# Patient Record
Sex: Female | Born: 1935 | Race: White | Hispanic: No | Smoking: Never smoker
Health system: Southern US, Community
[De-identification: ages and names within clinical notes are randomized; demographics above are authoritative.]

## PROBLEM LIST (undated history)

## (undated) DIAGNOSIS — I10 Essential (primary) hypertension: Secondary | ICD-10-CM

## (undated) DIAGNOSIS — G309 Alzheimer's disease, unspecified: Secondary | ICD-10-CM

## (undated) DIAGNOSIS — M329 Systemic lupus erythematosus, unspecified: Secondary | ICD-10-CM

## (undated) DIAGNOSIS — I509 Heart failure, unspecified: Secondary | ICD-10-CM

## (undated) DIAGNOSIS — I4891 Unspecified atrial fibrillation: Secondary | ICD-10-CM

## (undated) DIAGNOSIS — I779 Disorder of arteries and arterioles, unspecified: Secondary | ICD-10-CM

## (undated) DIAGNOSIS — IMO0002 Reserved for concepts with insufficient information to code with codable children: Secondary | ICD-10-CM

## (undated) DIAGNOSIS — J449 Chronic obstructive pulmonary disease, unspecified: Secondary | ICD-10-CM

## (undated) DIAGNOSIS — F0280 Dementia in other diseases classified elsewhere without behavioral disturbance: Secondary | ICD-10-CM

## (undated) DIAGNOSIS — E7800 Pure hypercholesterolemia, unspecified: Secondary | ICD-10-CM

## (undated) HISTORY — PX: TYMPANOSTOMY TUBE PLACEMENT: SHX32

## (undated) HISTORY — PX: FOOT SURGERY: SHX648

## (undated) HISTORY — PX: CHOLECYSTECTOMY: SHX55

## (undated) HISTORY — PX: TUBAL LIGATION: SHX77

---

## 2005-03-13 ENCOUNTER — Ambulatory Visit: Payer: Self-pay | Admitting: Cardiology

## 2005-03-14 ENCOUNTER — Inpatient Hospital Stay (HOSPITAL_COMMUNITY): Admission: AD | Admit: 2005-03-14 | Discharge: 2005-03-18 | Payer: Self-pay | Admitting: Internal Medicine

## 2005-03-14 ENCOUNTER — Ambulatory Visit: Payer: Self-pay | Admitting: *Deleted

## 2005-04-10 ENCOUNTER — Ambulatory Visit: Payer: Self-pay | Admitting: Cardiology

## 2005-08-01 ENCOUNTER — Ambulatory Visit: Payer: Self-pay | Admitting: Cardiology

## 2005-11-20 ENCOUNTER — Ambulatory Visit: Payer: Self-pay | Admitting: Cardiology

## 2006-02-27 ENCOUNTER — Ambulatory Visit: Payer: Self-pay | Admitting: Cardiology

## 2007-08-08 ENCOUNTER — Inpatient Hospital Stay (HOSPITAL_COMMUNITY): Admission: EM | Admit: 2007-08-08 | Discharge: 2007-08-12 | Payer: Self-pay | Admitting: Emergency Medicine

## 2007-08-10 ENCOUNTER — Encounter (INDEPENDENT_AMBULATORY_CARE_PROVIDER_SITE_OTHER): Payer: Self-pay | Admitting: Internal Medicine

## 2007-08-12 ENCOUNTER — Ambulatory Visit: Payer: Self-pay | Admitting: Gastroenterology

## 2007-08-12 ENCOUNTER — Ambulatory Visit: Payer: Self-pay | Admitting: Infectious Diseases

## 2007-11-17 ENCOUNTER — Ambulatory Visit: Payer: Self-pay | Admitting: Cardiology

## 2007-11-24 ENCOUNTER — Ambulatory Visit: Payer: Self-pay | Admitting: Cardiology

## 2007-11-26 ENCOUNTER — Ambulatory Visit: Payer: Self-pay | Admitting: Cardiology

## 2007-11-26 ENCOUNTER — Inpatient Hospital Stay (HOSPITAL_COMMUNITY): Admission: AD | Admit: 2007-11-26 | Discharge: 2007-11-30 | Payer: Self-pay | Admitting: Cardiology

## 2007-12-16 ENCOUNTER — Ambulatory Visit: Payer: Self-pay | Admitting: Cardiology

## 2008-11-29 ENCOUNTER — Ambulatory Visit: Payer: Self-pay | Admitting: Cardiology

## 2009-02-06 ENCOUNTER — Ambulatory Visit: Payer: Self-pay | Admitting: Cardiology

## 2009-04-20 ENCOUNTER — Emergency Department (HOSPITAL_COMMUNITY): Admission: EM | Admit: 2009-04-20 | Discharge: 2009-04-20 | Payer: Self-pay | Admitting: Emergency Medicine

## 2009-05-07 ENCOUNTER — Emergency Department (HOSPITAL_COMMUNITY): Admission: EM | Admit: 2009-05-07 | Discharge: 2009-05-07 | Payer: Self-pay | Admitting: Emergency Medicine

## 2009-05-09 ENCOUNTER — Emergency Department (HOSPITAL_COMMUNITY): Admission: EM | Admit: 2009-05-09 | Discharge: 2009-05-09 | Payer: Self-pay | Admitting: Emergency Medicine

## 2009-08-23 DIAGNOSIS — I2510 Atherosclerotic heart disease of native coronary artery without angina pectoris: Secondary | ICD-10-CM | POA: Insufficient documentation

## 2009-08-23 DIAGNOSIS — R0602 Shortness of breath: Secondary | ICD-10-CM | POA: Insufficient documentation

## 2009-08-23 DIAGNOSIS — I080 Rheumatic disorders of both mitral and aortic valves: Secondary | ICD-10-CM

## 2009-08-23 DIAGNOSIS — I10 Essential (primary) hypertension: Secondary | ICD-10-CM | POA: Insufficient documentation

## 2009-11-19 ENCOUNTER — Ambulatory Visit: Payer: Self-pay | Admitting: Cardiology

## 2009-11-19 ENCOUNTER — Inpatient Hospital Stay (HOSPITAL_COMMUNITY): Admission: EM | Admit: 2009-11-19 | Discharge: 2009-11-21 | Payer: Self-pay | Admitting: Emergency Medicine

## 2009-11-20 ENCOUNTER — Encounter: Payer: Self-pay | Admitting: Cardiology

## 2009-11-20 ENCOUNTER — Encounter (INDEPENDENT_AMBULATORY_CARE_PROVIDER_SITE_OTHER): Payer: Self-pay | Admitting: Internal Medicine

## 2009-12-02 ENCOUNTER — Emergency Department (HOSPITAL_COMMUNITY)
Admission: EM | Admit: 2009-12-02 | Discharge: 2009-12-02 | Payer: Self-pay | Source: Home / Self Care | Admitting: Emergency Medicine

## 2009-12-02 ENCOUNTER — Encounter: Payer: Self-pay | Admitting: Physician Assistant

## 2010-03-27 ENCOUNTER — Encounter: Payer: Self-pay | Admitting: Physician Assistant

## 2010-03-29 ENCOUNTER — Ambulatory Visit: Payer: Self-pay | Admitting: Cardiology

## 2010-03-29 DIAGNOSIS — I498 Other specified cardiac arrhythmias: Secondary | ICD-10-CM | POA: Insufficient documentation

## 2010-03-29 DIAGNOSIS — Z950 Presence of cardiac pacemaker: Secondary | ICD-10-CM

## 2010-03-29 DIAGNOSIS — I5032 Chronic diastolic (congestive) heart failure: Secondary | ICD-10-CM

## 2010-04-15 ENCOUNTER — Encounter (INDEPENDENT_AMBULATORY_CARE_PROVIDER_SITE_OTHER): Payer: Self-pay | Admitting: *Deleted

## 2010-04-19 ENCOUNTER — Encounter: Payer: Self-pay | Admitting: Physician Assistant

## 2010-04-24 ENCOUNTER — Encounter (INDEPENDENT_AMBULATORY_CARE_PROVIDER_SITE_OTHER): Payer: Self-pay | Admitting: *Deleted

## 2010-05-02 ENCOUNTER — Ambulatory Visit: Payer: Self-pay | Admitting: Physician Assistant

## 2010-05-03 ENCOUNTER — Encounter (INDEPENDENT_AMBULATORY_CARE_PROVIDER_SITE_OTHER): Payer: Self-pay | Admitting: *Deleted

## 2010-07-02 ENCOUNTER — Ambulatory Visit: Payer: Self-pay | Admitting: Cardiology

## 2010-09-18 ENCOUNTER — Encounter: Payer: Self-pay | Admitting: Cardiology

## 2010-09-18 ENCOUNTER — Encounter: Payer: Self-pay | Admitting: Physician Assistant

## 2010-09-19 ENCOUNTER — Ambulatory Visit: Payer: Self-pay | Admitting: Cardiology

## 2010-09-20 ENCOUNTER — Encounter: Payer: Self-pay | Admitting: Physician Assistant

## 2010-09-20 ENCOUNTER — Encounter: Payer: Self-pay | Admitting: Cardiology

## 2010-09-25 ENCOUNTER — Ambulatory Visit: Payer: Self-pay | Admitting: Internal Medicine

## 2010-09-25 ENCOUNTER — Encounter: Payer: Self-pay | Admitting: Emergency Medicine

## 2010-09-25 ENCOUNTER — Ambulatory Visit: Payer: Self-pay | Admitting: Cardiovascular Disease

## 2010-09-25 ENCOUNTER — Encounter: Payer: Self-pay | Admitting: Physician Assistant

## 2010-09-26 ENCOUNTER — Encounter: Payer: Self-pay | Admitting: Physician Assistant

## 2010-09-26 ENCOUNTER — Encounter (INDEPENDENT_AMBULATORY_CARE_PROVIDER_SITE_OTHER): Payer: Self-pay | Admitting: Internal Medicine

## 2010-09-26 ENCOUNTER — Inpatient Hospital Stay (HOSPITAL_COMMUNITY): Admission: EM | Admit: 2010-09-26 | Discharge: 2010-09-30 | Payer: Self-pay | Admitting: Internal Medicine

## 2010-09-27 ENCOUNTER — Encounter: Payer: Self-pay | Admitting: Physician Assistant

## 2010-09-27 ENCOUNTER — Encounter (INDEPENDENT_AMBULATORY_CARE_PROVIDER_SITE_OTHER): Payer: Self-pay | Admitting: Internal Medicine

## 2010-09-27 ENCOUNTER — Ambulatory Visit: Payer: Self-pay | Admitting: Vascular Surgery

## 2010-09-29 ENCOUNTER — Encounter: Payer: Self-pay | Admitting: Physician Assistant

## 2010-09-30 ENCOUNTER — Encounter: Payer: Self-pay | Admitting: Physician Assistant

## 2010-10-31 ENCOUNTER — Ambulatory Visit: Payer: Self-pay | Admitting: Cardiology

## 2010-10-31 ENCOUNTER — Encounter (INDEPENDENT_AMBULATORY_CARE_PROVIDER_SITE_OTHER): Payer: Self-pay | Admitting: *Deleted

## 2010-10-31 ENCOUNTER — Encounter: Payer: Self-pay | Admitting: Physician Assistant

## 2010-10-31 DIAGNOSIS — R0989 Other specified symptoms and signs involving the circulatory and respiratory systems: Secondary | ICD-10-CM

## 2010-10-31 DIAGNOSIS — I959 Hypotension, unspecified: Secondary | ICD-10-CM | POA: Insufficient documentation

## 2010-11-13 ENCOUNTER — Encounter: Payer: Self-pay | Admitting: Physician Assistant

## 2010-11-21 ENCOUNTER — Encounter: Payer: Self-pay | Admitting: Physician Assistant

## 2010-11-21 ENCOUNTER — Encounter (INDEPENDENT_AMBULATORY_CARE_PROVIDER_SITE_OTHER): Payer: Self-pay | Admitting: *Deleted

## 2010-11-21 ENCOUNTER — Ambulatory Visit
Admission: RE | Admit: 2010-11-21 | Discharge: 2010-11-21 | Payer: Self-pay | Source: Home / Self Care | Attending: Physician Assistant | Admitting: Physician Assistant

## 2010-11-22 ENCOUNTER — Telehealth (INDEPENDENT_AMBULATORY_CARE_PROVIDER_SITE_OTHER): Payer: Self-pay | Admitting: *Deleted

## 2010-11-28 ENCOUNTER — Emergency Department (HOSPITAL_COMMUNITY)
Admission: EM | Admit: 2010-11-28 | Discharge: 2010-11-28 | Payer: Self-pay | Source: Home / Self Care | Admitting: Emergency Medicine

## 2010-11-29 ENCOUNTER — Encounter: Payer: Self-pay | Admitting: Cardiology

## 2010-12-02 LAB — DIFFERENTIAL
Basophils Relative: 0 % (ref 0–1)
Eosinophils Absolute: 0.5 10*3/uL (ref 0.0–0.7)
Eosinophils Relative: 13 % — ABNORMAL HIGH (ref 0–5)
Monocytes Absolute: 0.4 10*3/uL (ref 0.1–1.0)
Monocytes Relative: 10 % (ref 3–12)

## 2010-12-02 LAB — BASIC METABOLIC PANEL
CO2: 27 mEq/L (ref 19–32)
Calcium: 8.9 mg/dL (ref 8.4–10.5)
Creatinine, Ser: 1.31 mg/dL — ABNORMAL HIGH (ref 0.4–1.2)
GFR calc Af Amer: 47 mL/min — ABNORMAL LOW (ref >60)

## 2010-12-02 LAB — GLUCOSE, CAPILLARY: Glucose-Capillary: 220 mg/dL — ABNORMAL HIGH (ref 70–99)

## 2010-12-02 LAB — CBC
Hemoglobin: 12.4 g/dL (ref 12.0–15.0)
MCH: 34.5 pg — ABNORMAL HIGH (ref 26.0–34.0)
MCHC: 34.1 g/dL (ref 30.0–36.0)
Platelets: 148 10*3/uL — ABNORMAL LOW (ref 150–400)

## 2010-12-02 LAB — URINE MICROSCOPIC-ADD ON

## 2010-12-02 LAB — URINE CULTURE: Culture: NO GROWTH

## 2010-12-02 LAB — URINALYSIS, ROUTINE W REFLEX MICROSCOPIC
Hgb urine dipstick: NEGATIVE
Protein, ur: 100 mg/dL — AB
Urobilinogen, UA: 0.2 mg/dL (ref 0.0–1.0)

## 2010-12-02 LAB — POCT CARDIAC MARKERS: Myoglobin, poc: 110 ng/mL (ref 12–200)

## 2010-12-03 ENCOUNTER — Emergency Department (HOSPITAL_COMMUNITY)
Admission: EM | Admit: 2010-12-03 | Discharge: 2010-12-03 | Payer: Self-pay | Source: Home / Self Care | Admitting: Emergency Medicine

## 2010-12-04 LAB — BASIC METABOLIC PANEL
BUN: 24 mg/dL — ABNORMAL HIGH (ref 6–23)
Calcium: 9.2 mg/dL (ref 8.4–10.5)
GFR calc non Af Amer: 43 mL/min — ABNORMAL LOW (ref >60)
Glucose, Bld: 285 mg/dL — ABNORMAL HIGH (ref 70–99)

## 2010-12-04 LAB — DIFFERENTIAL
Lymphocytes Relative: 30 % (ref 12–46)
Lymphs Abs: 1.3 10*3/uL (ref 0.7–4.0)
Monocytes Absolute: 0.4 10*3/uL (ref 0.1–1.0)
Monocytes Relative: 9 % (ref 3–12)
Neutro Abs: 1.8 10*3/uL (ref 1.7–7.7)

## 2010-12-04 LAB — URINE MICROSCOPIC-ADD ON

## 2010-12-04 LAB — HEPATIC FUNCTION PANEL
ALT: 14 U/L (ref 0–35)
Bilirubin, Direct: 0.1 mg/dL (ref 0.0–0.3)
Indirect Bilirubin: 0.2 mg/dL — ABNORMAL LOW (ref 0.3–0.9)
Total Bilirubin: 0.3 mg/dL (ref 0.3–1.2)

## 2010-12-04 LAB — CBC
HCT: 36 % (ref 36.0–46.0)
Hemoglobin: 12.4 g/dL (ref 12.0–15.0)
MCH: 34.6 pg — ABNORMAL HIGH (ref 26.0–34.0)
MCHC: 34.4 g/dL (ref 30.0–36.0)

## 2010-12-04 LAB — POCT CARDIAC MARKERS: Myoglobin, poc: 99.7 ng/mL (ref 12–200)

## 2010-12-04 LAB — URINALYSIS, ROUTINE W REFLEX MICROSCOPIC
Leukocytes, UA: NEGATIVE
Specific Gravity, Urine: 1.025 (ref 1.005–1.030)
Urine Glucose, Fasting: 250 mg/dL — AB
pH: 5 (ref 5.0–8.0)

## 2010-12-09 ENCOUNTER — Emergency Department (HOSPITAL_COMMUNITY)
Admission: EM | Admit: 2010-12-09 | Discharge: 2010-12-09 | Payer: Self-pay | Source: Home / Self Care | Admitting: Emergency Medicine

## 2010-12-09 LAB — POCT CARDIAC MARKERS
CKMB, poc: <1 ng/mL — ABNORMAL LOW (ref 1.0–8.0)
Myoglobin, poc: 127 ng/mL (ref 12–200)
Myoglobin, poc: 126 ng/mL (ref 12–200)
Troponin i, poc: <0.05 ng/mL (ref 0.00–0.09)
Troponin i, poc: <0.05 ng/mL (ref 0.00–0.09)

## 2010-12-10 NOTE — Assessment & Plan Note (Signed)
Summary: 2 MO F/U PER OV W/GENE ON 6/23-JM   Visit Type:  Follow-up Primary Provider:  Sherril Croon   History of Present Illness: the patient is an 77 year old female with history of diastolic heart failure. She also states her bradycardia and hypertension and coronary artery.the patient's son is monitoring her bone status very carefully as well as her salt intake. She denies any chest pain chills of breath orthopnea PND. She is limited in her mobility because of orthopedic problems. Her short-term memory also has been gradually getting worse. The patient is also very hard hearing and history is very difficult. It does not appear that this chest pain shortness of breath orthopnea or PND.  Preventive Screening-Counseling & Management  Alcohol-Tobacco     Smoking Status: quit     Year Quit: 1980   Current Medications (verified): 1)  Amlodipine Besylate 5 Mg Tabs (Amlodipine Besylate) .... Take 1 Tablet By Mouth Once A Day 2)  Prilosec 20 Mg Cpdr (Omeprazole) .... Take 1 Capsule By Mouth Twice A Day 3)  Simvastatin 20 Mg Tabs (Simvastatin) .... Take 1 Tablet By Mouth At Bedtime 4)  Vitamin B-12 100 Mcg Tabs (Cyanocobalamin) .... Take 1 Tablet By Mouth Once A Day 5)  Caltrate 600+d 600-400 Mg-Unit Tabs (Calcium Carbonate-Vitamin D) .... Take 1 Tablet By Mouth Three Times A Day 6)  Humalog Mix 75/25 75-25 % Susp (Insulin Lispro Prot & Lispro) .... Use As Directed 7)  Alprazolam 0.25 Mg Tabs (Alprazolam) .... Take 1/2-1 Tablet By Mouth Three Times A Day As Needed 8)  Hydrocodone-Acetaminophen 7.5-325 Mg Tabs (Hydrocodone-Acetaminophen) .... Take 1 Tablet By Mouth Three Times A Day As Needed 9)  Furosemide 20 Mg Tabs (Furosemide) .... Take 1 Tablet By Mouth Once A Day 10)  Aspir-Low 81 Mg Tbec (Aspirin) .... Take 1 Tablet By Mouth Once A Day 11)  Isosorbide Mononitrate 20 Mg Tabs (Isosorbide Mononitrate) .... Take 1 Tablet By Mouth Two Times A Day 12)  Lisinopril 40 Mg Tabs (Lisinopril) .... Take 1  Tablet By Mouth Once A Day 13)  Omeprazole 20 Mg Cpdr (Omeprazole) .... Take 1 Tablet By Mouth Once A Day 14)  Allopurinol 100 Mg Tabs (Allopurinol) .... Take 1 Tablet By Mouth Once A Day 15)  Dicyclomine Hcl 10 Mg Caps (Dicyclomine Hcl) .... Take 1 Tablet By Mouth Once A Day As Needed 16)  Lidoderm 5 % Ptch (Lidocaine) .... Apply Patch 12hours On and 12 Hours Off 17)  Proair Hfa 108 (90 Base) Mcg/act Aers (Albuterol Sulfate) .... As Needed 18)  Triamcinolone Acetonide 0.1 % Crea (Triamcinolone Acetonide) .... Apply Topically Two Times A Day 19)  Nitrostat 0.4 Mg Subl (Nitroglycerin) .Marland Kitchen.. 1 Tablet Under Tongue At Onset of Chest Pain; You May Repeat Every 5 Minutes For Up To 3 Doses.  Allergies (verified): 1)  ! Ativan 2)  ! Haldol  Comments:  Nurse/Medical Assistant: The patient is currently on medications but does not know the name or dosage at this time. Instructed to contact our office with details. Will update medication list at that time.  Past History:  Past Medical History: Last updated: 05/02/2010 MITRAL INSUFFICIENCY (ICD-396.3) HYPERTENSION, UNSPECIFIED (ICD-401.9) CAD, NATIVE VESSEL (ICD-414.01)... treated medically...borderline LAD lesion by cath, 1/09 Diabetes mellitus Chronic diastolic heart failure...EF 75%; no focal wall motion abns; NL RVF by echo, 1/11 Chronic kidney disease Interstitial lung disease Dyslipidemia Obesity Alzheimer's dementia  Past Surgical History: Last updated: 08/23/2009 Cholecystectomy  Social History: Last updated: 08/23/2009 Retired  Single  Tobacco Use -  Former.   Review of Systems       The patient complains of fatigue, shortness of breath, muscle weakness, and skin lesions.  The patient denies malaise, fever, weight gain/loss, vision loss, decreased hearing, hoarseness, chest pain, palpitations, prolonged cough, wheezing, sleep apnea, coughing up blood, abdominal pain, blood in stool, nausea, vomiting, diarrhea, heartburn,  incontinence, blood in urine, joint pain, leg swelling, rash, headache, fainting, dizziness, depression, anxiety, enlarged lymph nodes, easy bruising or bleeding, and environmental allergies.    Vital Signs:  Patient profile:   78 year old female Height:      60 inches Weight:      205 pounds Pulse rate:   55 / minute BP sitting:   145 / 79  (left arm) Cuff size:   large  Vitals Entered By: Carlye Grippe (July 02, 2010 1:34 PM)  Physical Exam  Additional Exam:  GEN:77 year old female, obese, in no distress HEENT: NCAT,PERRLA,EOMI NECK: palpable pulses, no bruits; no JVD; no TM LUNGS: mild bibasilar crackles, no wheezes HEART: RRR (S1S2), with diminished heart sounds; no significant murmurs; no rubs; no gallops ABD: protuberant, soft, NT; intact BS [id number], 2+ bilateral, nonpitting edema SKIN: warm, dry MUSC: no obvious deformity NEURO: A/O (x3)     Impression & Recommendations:  Problem # 1:  CHRONIC DIASTOLIC HEART FAILURE (ICD-428.32) continue current medical therapy. I made no changes. Her updated medication list for this problem includes:    Amlodipine Besylate 5 Mg Tabs (Amlodipine besylate) .Marland Kitchen... Take 1 tablet by mouth once a day    Furosemide 20 Mg Tabs (Furosemide) .Marland Kitchen... Take 1 tablet by mouth once a day    Aspir-low 81 Mg Tbec (Aspirin) .Marland Kitchen... Take 1 tablet by mouth once a day    Isosorbide Mononitrate 20 Mg Tabs (Isosorbide mononitrate) .Marland Kitchen... Take 1 tablet by mouth two times a day    Lisinopril 40 Mg Tabs (Lisinopril) .Marland Kitchen... Take 1 tablet by mouth once a day    Nitrostat 0.4 Mg Subl (Nitroglycerin) .Marland Kitchen... 1 tablet under tongue at onset of chest pain; you may repeat every 5 minutes for up to 3 doses.  Problem # 2:  BRADYCARDIA (ICD-427.89) heart rhythm stable. Her updated medication list for this problem includes:    Amlodipine Besylate 5 Mg Tabs (Amlodipine besylate) .Marland Kitchen... Take 1 tablet by mouth once a day    Aspir-low 81 Mg Tbec (Aspirin) .Marland Kitchen... Take 1 tablet  by mouth once a day    Isosorbide Mononitrate 20 Mg Tabs (Isosorbide mononitrate) .Marland Kitchen... Take 1 tablet by mouth two times a day    Lisinopril 40 Mg Tabs (Lisinopril) .Marland Kitchen... Take 1 tablet by mouth once a day    Nitrostat 0.4 Mg Subl (Nitroglycerin) .Marland Kitchen... 1 tablet under tongue at onset of chest pain; you may repeat every 5 minutes for up to 3 doses.  Problem # 3:  CAD, NATIVE VESSEL (ICD-414.01) no recurrent chest pain continue medical therapy Her updated medication list for this problem includes:    Amlodipine Besylate 5 Mg Tabs (Amlodipine besylate) .Marland Kitchen... Take 1 tablet by mouth once a day    Aspir-low 81 Mg Tbec (Aspirin) .Marland Kitchen... Take 1 tablet by mouth once a day    Isosorbide Mononitrate 20 Mg Tabs (Isosorbide mononitrate) .Marland Kitchen... Take 1 tablet by mouth two times a day    Lisinopril 40 Mg Tabs (Lisinopril) .Marland Kitchen... Take 1 tablet by mouth once a day    Nitrostat 0.4 Mg Subl (Nitroglycerin) .Marland Kitchen... 1 tablet under tongue at onset of chest pain; you  may repeat every 5 minutes for up to 3 doses.  Patient Instructions: 1)  Your physician recommends that you continue on your current medications as directed. Please refer to the Current Medication list given to you today. 2)  Follow up in  6 months

## 2010-12-10 NOTE — Letter (Signed)
Summary: Engineer, materials at [facility]  [address]   [place], [place] [address]   Phone: [phone]  Fax: [phone]        April 24, 2010 MRN: [id number]   Cherilynn Walstad [address] [place], [place]  [address]   Dear Ms. Blansett,  Your test ordered by Selena Batten has been reviewed by your physician (or physician assistant) and was found to be normal or stable. Your physician (or physician assistant) felt no changes were needed at this time.  ____ Echocardiogram  ____ Cardiac Stress Test  __X__ Lab Work  ____ Peripheral vascular study of arms, legs or neck  ____ CT scan or X-ray  ____ Lung or Breathing test  ____ Other:   Thank you.   Laray Anger, M.D., F.A.C.C. Jeanne Ivan, PA-C

## 2010-12-10 NOTE — Assessment & Plan Note (Signed)
Summary: 1 MONTH F/U ON DAY W/GS AND GD-JM   Visit Type:  Follow-up Primary Provider:  Vyas   History of Present Illness: 77 year old female returns for early scheduled followup.  When last seen, she had just recently been taken off Metroprolol, in the context of symptomatic bradycardia with resting heart rates as low as 40 bpm. When I saw her, I increased her Lasix for 5 days, for treatment of diastolic CHF exacerbation. I ordered followup labs, indicating K and renal function. A BNP level, however, was not executed. TSH and CBC were normal. LDL 36.  Patient returns today having diuresed over 30 pounds. Her son and daughter-in-law, who were not present here last month, are thrilled as to how well she has been doing. As noted earlier, the patient has significant underlying dementia, and is not a reliable historian.  Preventive Screening-Counseling & Management  Alcohol-Tobacco     Smoking Status: quit  Current Medications (verified): 1)  Amlodipine Besylate 5 Mg Tabs (Amlodipine Besylate) .... Take 1 Tablet By Mouth Once A Day 2)  Prilosec 20 Mg Cpdr (Omeprazole) .... Take 1 Capsule By Mouth Twice A Day 3)  Simvastatin 20 Mg Tabs (Simvastatin) .... Take 1 Tablet By Mouth At Bedtime 4)  Vitamin B-12 100 Mcg Tabs (Cyanocobalamin) .... Take 1 Tablet By Mouth Once A Day 5)  Caltrate 600+d 600-400 Mg-Unit Tabs (Calcium Carbonate-Vitamin D) .... Take 1 Tablet By Mouth Three Times A Day 6)  Humalog Mix 75/25 75-25 % Susp (Insulin Lispro Prot & Lispro) .... Use As Directed 7)  Alprazolam 0.25 Mg Tabs (Alprazolam) .... Take 1/2-1 Tablet By Mouth Three Times A Day As Needed 8)  Hydrocodone-Acetaminophen 7.5-325 Mg Tabs (Hydrocodone-Acetaminophen) .... Take 1 Tablet By Mouth Three Times A Day As Needed 9)  Furosemide 20 Mg Tabs (Furosemide) .... Take 1 Tablet By Mouth Once A Day 10)  Aspir-Low 81 Mg Tbec (Aspirin) .... Take 1 Tablet By Mouth Once A Day 11)  Isosorbide Mononitrate 20 Mg Tabs  (Isosorbide Mononitrate) .... Take 1 Tablet By Mouth Two Times A Day 12)  Lisinopril 40 Mg Tabs (Lisinopril) .... Take 1 Tablet By Mouth Once A Day 13)  Omeprazole 20 Mg Cpdr (Omeprazole) .... Take 1 Tablet By Mouth Once A Day 14)  Allopurinol 100 Mg Tabs (Allopurinol) .... Take 1 Tablet By Mouth Once A Day 15)  Dicyclomine Hcl 10 Mg Caps (Dicyclomine Hcl) .... Take 1 Tablet By Mouth Once A Day As Needed 16)  Lidoderm 5 % Ptch (Lidocaine) .... Apply Patch 12hours On and 12 Hours Off 17)  Proair Hfa 108 (90 Base) Mcg/act Aers (Albuterol Sulfate) .... As Needed 18)  Triamcinolone Acetonide 0.1 % Crea (Triamcinolone Acetonide) .... Apply Topically Two Times A Day 19)  Nitrostat 0.4 Mg Subl (Nitroglycerin) .Marland Kitchen.. 1 Tablet Under Tongue At Onset of Chest Pain; You May Repeat Every 5 Minutes For Up To 3 Doses.  Allergies: 1)  ! Ativan 2)  ! Haldol  Comments:  Nurse/Medical Assistant: Pt did not bring medication list to office visit today. Her family will reviews meds as per instructions sheet and notify of any corrections. Cyril Loosen, RN, BSN (May 02, 2010 1:13 PM)  Past History:  Past Medical History: Last updated: 03/29/2010 MITRAL INSUFFICIENCY (ICD-396.3) HYPERTENSION, UNSPECIFIED (ICD-401.9) CAD, NATIVE VESSEL (ICD-414.01)... treated medically Diabetes mellitus Chronic diastolic heart failure Chronic kidney disease Interstitial lung disease Dyslipidemia Obesity Alzheimer's dementia  Review of Systems  The patient denies chest pain, syncope, and dyspnea on exertion.  has had recent cough and has chronic lower extremity edema, but with no recent exacerbation. Remaining systems reviewed, and are negative  Vital Signs:  Patient profile:   77 year old female Height:      60 inches Weight:      172.25 pounds Pulse rate:   60 / minute BP sitting:   105 / 65  (right arm) Cuff size:   large  Vitals Entered By: Cyril Loosen, RN, BSN (May 02, 2010 1:12  PM)   Physical Exam  Additional Exam:  GEN:77 year old female, obese, in no distress HEENT: NCAT,PERRLA,EOMI NECK: palpable pulses, no bruits; no JVD; no TM LUNGS: mild bibasilar crackles, no wheezes HEART: RRR (S1S2), with diminished heart sounds; no significant murmurs; no rubs; no gallops ABD: protuberant, soft, NT; intact BS [id number], 2+ bilateral, nonpitting edema SKIN: warm, dry MUSC: no obvious deformity NEURO: A/O (x3)     Impression & Recommendations:  Problem # 1:  CHRONIC DIASTOLIC HEART FAILURE (ICD-428.32)  patient has lost 36 pounds since her last visit and appears to be much improved, as reported by her family. Followup labs were normal. Will continue low dose maintenance furosemide, and repeat a complete metabolic profile, and also include a baseline BNP level. Family was advised to monitor daily weights, and to refrain from any added salt in her diet. Will schedule return clinic visit with myself and Dr. Andee Lineman in 2 months.  Problem # 2:  BRADYCARDIA (ICD-427.89)  resolved, following previous discontinuation of metoprolol. Continue current medications.  Problem # 3:  HYPERTENSION, UNSPECIFIED (ICD-401.9)  well-controlled on current medications.  Problem # 4:  CAD, NATIVE VESSEL (ICD-414.01)  continue conservative management with medical therapy.  Other Orders: T-Comprehensive Metabolic Panel [id number]) T-BNP  (B Natriuretic Peptide) ([id number])  Patient Instructions: 1)  Your physician recommends that you go to the [facility] for lab work: Do today! 2)  Follow up with Dr. Earnestine Leys on Tuesday, July 02, 2010 at 1:30pm.

## 2010-12-10 NOTE — Miscellaneous (Signed)
  Clinical Lists Changes  Observations: Added new observation of PAST MED HX: MITRAL INSUFFICIENCY (ICD-396.3) HYPERTENSION, UNSPECIFIED (ICD-401.9) CAD, NATIVE VESSEL (ICD-414.01)... treated medically...borderline LAD lesion by cath, 1/09 Diabetes mellitus Chronic diastolic heart failure...EF 75%; no focal wall motion abns; NL RVF by echo, 1/11 Chronic kidney disease Interstitial lung disease Dyslipidemia Obesity Alzheimer's dementia  (05/02/2010 13:55)       Past History:  Past Medical History: MITRAL INSUFFICIENCY (ICD-396.3) HYPERTENSION, UNSPECIFIED (ICD-401.9) CAD, NATIVE VESSEL (ICD-414.01)... treated medically...borderline LAD lesion by cath, 1/09 Diabetes mellitus Chronic diastolic heart failure...EF 75%; no focal wall motion abns; NL RVF by echo, 1/11 Chronic kidney disease Interstitial lung disease Dyslipidemia Obesity Alzheimer's dementia

## 2010-12-10 NOTE — Assessment & Plan Note (Signed)
Summary: bradycardia -- agh   Visit Type:  bradycardia Primary Provider:  Vyas   History of Present Illness: 77 year old female, patient of Dr. Andee Lineman, with history of multivessel CAD, treated medically, chronic diastolic heart failure, HTN, DM, MR, CRI, ILD, and dementia.   During a scheduled follow up with Dr. Sherril Croon earlier this week, patient appeared much more sluggish than usual. A 12-lead EKG was obtained, and indicated a resting heart rate of 40 bpm. Patient had been placed on metoprolol earlier this year, when hospitalized at [facility] for chest pain. She was felt to have both typical/atypical features, including discomfort reproducible by palpation. Serial cardiac markers were normal. She was placed on metoprolol 25 b.i.d., and Imdur was increased to 60 b.i.d.   The patient has significant dementia, and lives with her daughter, who is present today. The daughter points out that the patient has appeared to be much more sluggish these past few weeks. She continues to have occasional chest pain, but with no exacerbation. She denies any evidence of falls or syncope.  Patient was instructed to stop metoprolol, when seen by Dr. Sherril Croon the other day. Her daughter suggests that the patient appears much more alert since then, particularly today.  Preventive Screening-Counseling & Management  Alcohol-Tobacco     Smoking Status: quit     Year Quit: 1980  Current Medications (verified): 1)  Amlodipine Besylate 5 Mg Tabs (Amlodipine Besylate) .... Take 1 Tablet By Mouth Once A Day 2)  Prilosec 20 Mg Cpdr (Omeprazole) .... Take 1 Capsule By Mouth Twice A Day 3)  Simvastatin 20 Mg Tabs (Simvastatin) .... Take 1 Tablet By Mouth At Bedtime 4)  Vitamin B-12 100 Mcg Tabs (Cyanocobalamin) .... Take 1 Tablet By Mouth Once A Day 5)  Caltrate 600+d 600-400 Mg-Unit Tabs (Calcium Carbonate-Vitamin D) .... Take 1 Tablet By Mouth Three Times A Day 6)  Humalog Mix 75/25 75-25 % Susp (Insulin Lispro Prot & Lispro)  .... Use As Directed 7)  Alprazolam 0.25 Mg Tabs (Alprazolam) .... Take 1/2-1 Tablet By Mouth Three Times A Day As Needed 8)  Hydrocodone-Acetaminophen 7.5-325 Mg Tabs (Hydrocodone-Acetaminophen) .... Take 1 Tablet By Mouth Three Times A Day As Needed 9)  Furosemide 20 Mg Tabs (Furosemide) .... Take 1 Tablet By Mouth Once A Day 10)  Aspir-Low 81 Mg Tbec (Aspirin) .... Take 1 Tablet By Mouth Once A Day 11)  Isosorbide Mononitrate 20 Mg Tabs (Isosorbide Mononitrate) .... Take 1 Tablet By Mouth Two Times A Day 12)  Lisinopril 40 Mg Tabs (Lisinopril) .... Take 1 Tablet By Mouth Once A Day 13)  Omeprazole 20 Mg Cpdr (Omeprazole) .... Take 1 Tablet By Mouth Once A Day 14)  Allopurinol 100 Mg Tabs (Allopurinol) .... Take 1 Tablet By Mouth Once A Day 15)  Dicyclomine Hcl 10 Mg Caps (Dicyclomine Hcl) .... Take 1 Tablet By Mouth Once A Day As Needed 16)  Lidoderm 5 % Ptch (Lidocaine) .... Apply Patch 12hours On and 12 Hours Off 17)  Proair Hfa 108 (90 Base) Mcg/act Aers (Albuterol Sulfate) .... As Needed 18)  Triamcinolone Acetonide 0.1 % Crea (Triamcinolone Acetonide) .... Apply Topically Two Times A Day  Allergies (verified): 1)  ! Ativan 2)  ! Haldol  Comments:  Nurse/Medical Assistant: The patient's medications and allergies were reviewed with the patient and were updated in the Medication and Allergy Lists. List reviewed.  Past History:  Past Medical History: MITRAL INSUFFICIENCY (ICD-396.3) HYPERTENSION, UNSPECIFIED (ICD-401.9) CAD, NATIVE VESSEL (ICD-414.01)... treated medically Diabetes mellitus Chronic diastolic  heart failure Chronic kidney disease Interstitial lung disease Dyslipidemia Obesity Alzheimer's dementia  Review of Systems       No fevers, chills, hemoptysis, dysphagia, melena, hematocheezia, hematuria, rash, claudication. patient reportedly does have to sleep on 2 pillows, has chronic pedal edema, but no PND. All other systems negative.   Vital Signs:  Patient  profile:   77 year old female Height:      60 inches Weight:      208 pounds BMI:     40.77 Pulse rate:   81 / minute BP sitting:   115 / 70  (left arm) Cuff size:   large  Vitals Entered By: Carlye Grippe (Mar 29, 2010 2:36 PM)  Physical Exam  Additional Exam:  GEN:77 year old female, obese, in no distress HEENT: NCAT,PERRLA,EOMI NECK: palpable pulses, no bruits; no JVD; no TM LUNGS: mild bibasal crackles, no wheezes HEART: RRR (S1S2); no significant murmurs; no rubs; no gallops ABD: soft, NT; intact BS [id number], 2+ bilateral, nonpitting edema SKIN: warm, dry MUSC: no obvious deformity NEURO: A/O (x3)     EKG  Procedure date:  03/27/2010  Findings:      twelve-lead EKG, from Dr. Bonnita Levan office, indicates marked sinus bradycardia at 44 bpm with LAD, in no acute changes.  Impression & Recommendations:  Problem # 1:  BRADYCARDIA (ICD-427.89) bradycardia has resolved, following recent discontinuation of metoprolol. Heart rate today is 81 bpm. Pressure is normal. Patient appears to have increased energy, as reported by her daughter. She is not suggesting an acceleration of her baseline angina pattern. Therefore, would not resume beta blocker, but would suggest increasing isosorbide, if she were to have worsening chest pain. We'll provide p.r.n. nitroglycerin. We'll schedule return clinical with myself and Dr. Andee Lineman in one month, or sooner if needed.  Problem # 2:  CHRONIC DIASTOLIC HEART FAILURE (ICD-428.32) patient has evidence of volume overload on physical examination. Will increase Lasix to 40 mg daily (x5 days), then resume maintenance dose of 20 mg daily. Patient is due for complete blood work, and we will check a repeat metabolic profile in one week, for close monitoring of electrolytes and renal function. Will also add a BNP level, to help tailor her treatment regimen.  Problem # 3:  CAD, NATIVE VESSEL (ICD-414.01) continue medication regimen. If needed, will increase  isosorbide, for worsening chest pain. Continued conservative management is recommended.  Problem # 4:  HYPERTENSION, UNSPECIFIED (ICD-401.9) Assessment: Comment Only  Other Orders: T-Basic Metabolic Panel [id number])  Patient Instructions: 1)  Increase Lasix to 40mg  by mouth for 5 days (2 of the 20mg  tablets). Then resume 20mg  by mouth once daily. 2)  Your physician recommended you take 1 tablet (or 1 spray) under tongue at onset of chest pain; you may repeat every 5 minutes for up to 3 doses. If 3 or more doses are required, call 911 and proceed to the ER immediately. 3)  Your physician recommends that you go to the [facility] for lab work: DO LAB WORK IN 1 WEEK (AROUND 5/27) 4)  Your physician recommends that you weigh, daily, at the same time every day, and in the same amount of clothing.  Please record your daily weights on the handout provided and bring it to your next appointment. 5)  NO SALT DIET. 6)  FOLLOW UP WITH GENE Lachandra Dettmann, PA-C ON THURSDAY, JUNE 23RD AT 1PM. Prescriptions: NITROSTAT 0.4 MG SUBL (NITROGLYCERIN) 1 tablet under tongue at onset of chest pain; you may repeat every 5 minutes for up  to 3 doses.  #25 x 1   Entered by:   Cyril Loosen, RN, BSN   Authorized by:   Nelida Meuse, PA-C   Signed by:   Cyril Loosen, RN, BSN on 03/29/2010   Method used:   Electronically to        [facility]* (retail)       [address]       [place], [place]  [address]       Ph: [phone]       Fax: [phone]   RxID:   [id number]

## 2010-12-10 NOTE — Consult Note (Signed)
Summary: Consultation Report - [facility]  Consultation Report - [facility]   Imported By: Marylou Mccoy 12/10/2009 14:00:02  _____________________________________________________________________  External Attachment:    Type:   Image     Comment:   External Document

## 2010-12-10 NOTE — Letter (Signed)
Summary: External Correspondence/ OFFICE VISIT [facility]  External Correspondence/ OFFICE VISIT [facility]   Imported By: Dorise Hiss 03/27/2010 15:08:40  _____________________________________________________________________  External Attachment:    Type:   Image     Comment:   External Document

## 2010-12-10 NOTE — Medication Information (Signed)
Summary: RX Folder/ MEDICATIONS [facility]  RX Folder/ MEDICATIONS [facility]   Imported By: Dorise Hiss 03/27/2010 15:10:52  _____________________________________________________________________  External Attachment:    Type:   Image     Comment:   External Document

## 2010-12-10 NOTE — Letter (Signed)
Summary: Engineer, materials at [facility]  [address]   [place], [place] [address]   Phone: [phone]  Fax: [phone]        May 03, 2010 MRN: [id number]   [name] [address] [place], [place]  [address]   Dear Ms. Agcaoili,  Your test ordered by Selena Batten has been reviewed by your physician (or physician assistant) and was found to be normal or stable. Your physician (or physician assistant) felt no changes were needed at this time.  ____ Echocardiogram  ____ Cardiac Stress Test  __X__ Lab Work  ____ Peripheral vascular study of arms, legs or neck  ____ CT scan or X-ray  ____ Lung or Breathing test  ____ Other:   Thank you.   Hoover Brunette, LPN    Duane Boston, M.D., F.A.C.C. Thressa Sheller, M.D., F.A.C.C. Oneal Grout, M.D., F.A.C.C. Cheree Ditto, M.D., F.A.C.C. Daiva Nakayama, M.D., F.A.C.C. Kenney Houseman, M.D., F.A.C.C. Jeanne Ivan, PA-C

## 2010-12-10 NOTE — Letter (Signed)
Summary: Generic Engineer, agricultural at [facility] [address]   [place], [place] [address]   Phone: [phone]  Fax: [phone]        April 15, 2010 MRN: [id number]    [name] [address] [place], [place]  [address]    Dear Ms. Markman,    You were asked to have lab work done one week following you office visit on May 20th. We also mailed a letter to you on June 2nd to remind you to have this lab work done. However, it does not appear this lab work has been completed yet.  Please, take the enclosed order to the [facility] as soon as possible to have this done. If you will not be able to do this lab work, please notify our office so that we can properly document this in your chart.      Sincerely,  Cyril Loosen, RN, BSN  This letter has been electronically signed by your physician.  Appended Document: Generic Letter Letter will be mailed certified. Attempted to reach pt by phone, but number is not in service.

## 2010-12-12 NOTE — Assessment & Plan Note (Signed)
Summary: 2-3 WK F/U 12/22 OV-JM   Visit Type:  Follow-up Primary Provider:  Sherril Croon   History of Present Illness: [name] presents for early scheduled followup. Please refer to my OV note of 12/22, for complete details.  When last seen, I stopped her lisinopril, and decreased Norvasc, for correction of significant hypotension. I was also concerned that she might have left SCA stenosis; however, carotid Dopplers did not suggest this. I also referred her for a dobutamine stress echocardiogram, for risk stratification, and this was negative. Followup labs were stable.  Clinically, the [name] appears quite stable. She has significant dementia, and history is provided by her daughter.  [name] has gained 2 pounds since last OV. She is currently not on a diuretic. Her daughter states that she gives this to her only on occasion, concerned with development of renal insufficiency, in the past.  Preventive Screening-Counseling & Management  Alcohol-Tobacco     Smoking Status: quit     Year Quit: 1980  Current Medications (verified): 1)  Amlodipine Besylate 2.5 Mg Tabs (Amlodipine Besylate) .... Take One Tablet By Mouth Daily 2)  Simvastatin 20 Mg Tabs (Simvastatin) .... Take 1 Tablet By Mouth Two Times A Day 3)  Vitamin B-12 100 Mcg Tabs (Cyanocobalamin) .... Take 1 Tablet By Mouth Once A Day 4)  Oscal 500/200 D-3 500-200 Mg-Unit Tabs (Calcium Carbonate-Vitamin D) .... Take 1 Tablet By Mouth Two Times A Day 5)  Humalog Mix 75/25 75-25 % Susp (Insulin Lispro Prot & Lispro) .... Use As Directed 6)  Aspir-Low 81 Mg Tbec (Aspirin) .... Take 1 Tablet By Mouth Once A Day 7)  Isosorbide Mononitrate 20 Mg Tabs (Isosorbide Mononitrate) .... Take 1 Tablet By Mouth Two Times A Day 8)  Omeprazole 20 Mg Cpdr (Omeprazole) .... Take 1 Tablet By Mouth Once A Day 9)  Allopurinol 100 Mg Tabs (Allopurinol) .... Take 1 Tablet By Mouth Once A Day 10)  Dicyclomine Hcl 10 Mg Caps (Dicyclomine Hcl) .... Take 1 Tablet By  Mouth Four Times A Day As Needed 11)  Nitrostat 0.4 Mg Subl (Nitroglycerin) .Marland Kitchen.. 1 Tablet Under Tongue At Onset of Chest Pain; You May Repeat Every 5 Minutes For Up To 3 Doses. 12)  Zofran 4 Mg Tabs (Ondansetron Hcl) .... Take One By Mouth Every 6 Hours As Needed 13)  Vitamin B-12 1000 Mcg Tabs (Cyanocobalamin) .... Take 1 Tablet By Mouth Once A Day  Allergies (verified): 1)  ! Ativan 2)  ! Haldol  Comments:  Nurse/Medical Assistant: The [name] is currently on medications but does not know the name or dosage at this time. Instructed to contact our office with details. Will update medication list at that time.  Past History:  Past Medical History: Last updated: 05/02/2010 MITRAL INSUFFICIENCY (ICD-396.3) HYPERTENSION, UNSPECIFIED (ICD-401.9) CAD, NATIVE VESSEL (ICD-414.01)... treated medically...borderline LAD lesion by cath, 1/09 Diabetes mellitus Chronic diastolic heart failure...EF 75%; no focal wall motion abns; NL RVF by echo, 1/11 Chronic kidney disease Interstitial lung disease Dyslipidemia Obesity Alzheimer's dementia  Review of Systems       No fevers, chills, hemoptysis, dysphagia, melena, hematocheezia, hematuria, rash, claudication, orthopnea, or pnd. All other systems negative.   Vital Signs:  [name] profile:   77 year old female Height:      60 inches Weight:      213 pounds Pulse rate:   65 / minute BP sitting:   129 / 85  (right arm) Cuff size:   large  Vitals Entered By: Carlye Grippe (November 21, 2010 1:56 PM)  Physical Exam  Additional Exam:  GEN:77 year old female, obese, sitting in a wheelchair, in no distress HEENT: NCAT,PERRLA,EOMI NECK: palpable pulses, no bruits; no JVD; no TM LUNGS: mild bibasilar crackles, no wheezes HEART: RRR (S1S2), with diminished heart sounds; no significant murmurs; no rubs; no gallops ABD: protuberant, soft, NT; intact BS [id number], 2+ bilateral, nonpitting edema SKIN: warm, dry MUSC: no obvious  deformity NEURO: A/O (x3)     Impression & Recommendations:  Problem # 1:  CHRONIC DIASTOLIC HEART FAILURE (ICD-428.32)  Will add Lasix 20 mg daily, and check baseline metabolic profile/BNP level today, with repeat BMET in one week. [name] is to refrain from added salt in her diet, and to try to be weighed daily. She has gained 2 pounds since last OV.  Problem # 2:  HYPOTENSION (ICD-458.9)  much improved, following recent discontinuation of ACE inhibitor, and down titration of Norvasc. Of note, I am still concerned that she may have left SCA stenosis. She has an absent left radial pulse. I will have recent carotid Doppler study reviewed.  Problem # 3:  BRADYCARDIA (ICD-427.89)  resolved; on no nodal blocking agents.  Other Orders: T-Basic Metabolic Panel [phone]) T-BNP  (B Natriuretic Peptide) [phone]) T-Basic Metabolic Panel [id number])  [name] Instructions: 1)  Your physician wants you to follow-up in: 3 months. You will receive a reminder letter in the mail one-two months in advance. If you don't receive a letter, please call our office to schedule the follow-up appointment. 2)  No added salt diet. 3)  Your physician recommends that you weigh, daily, at the same time every day, and in the same amount of clothing.  Please record your daily weights on the handout provided and bring it to your next appointment. 4)  Lasix (furosemide) 20mg  by mouth once daily. 5)  Your physician recommends that you go to the [facility] for lab work: DO TODAY (BMET/BNP) AND LABS IN 1 WEEK (BMET) Prescriptions: FUROSEMIDE 20 MG TABS (FUROSEMIDE) Take one tablet by mouth daily.  #30 x 6   Entered by:   Cyril Loosen, RN, BSN   Authorized by:   Nelida Meuse, PA-C   Signed by:   Cyril Loosen, RN, BSN on 11/21/2010   Method used:   Electronically to        [facility]* (retail)       [address]       [place], [place]  [address]       Ph:  [phone]       Fax: [phone]   RxID:   [id number]   Handout requested.  I have reviewed and approved all prescriptions at the time of the office visit. Nelida Meuse, PA-C  November 21, 2010 2:44 PM

## 2010-12-12 NOTE — Progress Notes (Signed)
Summary: Weakness  Phone Note Call from Patient   Summary of Call: Pt's dgt called stating pt is so weak this morning she can barely get up. She states she is so far past being weak she doesn't know if she needs to be admitted or what, but "she is just so, so weak." Pt's dgt states when you pull her skin up it just stays there so she knows she is dehydrated and needs some fluid. Pt's dgt notified to take mother to ER for evaluation of severe weakness. Pt's dgt verbalized understanding. Initial call taken by: Cyril Loosen, RN, BSN,  November 22, 2010 11:56 AM

## 2010-12-12 NOTE — Letter (Signed)
Summary: [place] D/C DR. Blake Divine  [place] D/C DR. Blake Divine   Imported By: Zachary George 11/20/2010 16:37:15  _____________________________________________________________________  External Attachment:    Type:   Image     Comment:   External Document

## 2010-12-12 NOTE — Letter (Signed)
Summary: Engineer, materials at [facility]  [address]   [place], [place] [address]   Phone: [phone]  Fax: [phone]        November 21, 2010 MRN: [id number]   SHAELYN DECARLI [address], [place]  [address]   Dear Ms. Joiner,  Your test ordered by Selena Batten has been reviewed by your physician (or physician assistant) and was found to be normal or stable. Your physician (or physician assistant) felt no changes were needed at this time.  __X__ Echocardiogram  __X__ Cardiac Stress Test  __X__ Lab Work  __X__ Peripheral vascular study of arms, legs or neck - carotid doppler  ____ CT scan or X-ray  ____ Lung or Breathing test  ____ Other:   Thank you.   Hoover Brunette, LPN    Duane Boston, M.D., F.A.C.C. Thressa Sheller, M.D., F.A.C.C. Oneal Grout, M.D., F.A.C.C. Cheree Ditto, M.D., F.A.C.C. Daiva Nakayama, M.D., F.A.C.C. Kenney Houseman, M.D., F.A.C.C. Jeanne Ivan, PA-C

## 2010-12-12 NOTE — Letter (Signed)
Summary: Dobutamine Echocardiogram   [facility] [address]   [place], [place] [address]   Phone: [phone]  Fax: [phone]      [facility]  Dobutamine Echocardiogram    [name]  Appointment Date:_  Appointment Time:_   Your doctor has ordered a Dobutamine echo to help determine the condition of our heart. If you take blood pressure medication, ask your doctor if you should take your medications the day of the procedure. Do not eat and drink 4 hours before the test is scheduled.  You will be asked to undress from the waist up and given a hospital gown to wear, so dress comfortaly from the waist down.  You will need to register at the Outpatient Department at [facility], located at the front enttrance of the hospital. Make sure to bring your insurance information and a form of ID.  Your appointment will last approximately one and one-half hours   You may take your medications with water the morning of your test.

## 2010-12-12 NOTE — Medication Information (Signed)
Summary: [facility] D/C MEDICATION ORDER SHEET  [facility] D[facility]C MEDICATION ORDER SHEET   Imported By: Zachary George 10/31/2010 11:39:08  _____________________________________________________________________  External Attachment:    Type:   Image     Comment:   External Document

## 2010-12-12 NOTE — Assessment & Plan Note (Signed)
Summary: EPH-CONE 11/21 PER DEGENT ADD TO SEE GENE   Visit Type:  Follow-up Primary Analie Katzman:  Sherril Croon   History of Present Illness: patient presents for post hospital followup.  She was initially referred to [place] here at [facility] on November 10, for evaluation of chest pain, with history of nonobstructive CAD, and multiple cardiac risk factors. She had normal cardiac markers, and an elevated d-dimer with low probably VQ scan. We recommended a dobutamine echocardiogram, given her history of a false positive Cardiolite. However, I learned today from her daughter that the test was aborted (the patient apparently refused).  She was then rehospitalized initially at [facility] with chest pain, and transferred to [facility] on November 17. She ruled out for MI, and was referred for EP consultation, with question of possible pacemaker implantation for bradycardia. Of note, she was not on beta blockers at time of admission. She was noted to have a junctional rhythm at 50 bpm, but remained hemodynamically stable. A 2-D echo indicated EF 60-65%, no focal wall motion abnormalities, and no significant valvular abnormalities. Permanent pacemaker implantation was felt to not be indicated. She also had a CT angiogram of the chest which was negative for pulmonary embolus.  Regarding medications, she was started on isosorbide mononitrate and Norvasc.  Clinically, the son tells me that she remains easily fatigued, and has persistent DOE and post exercise chest pain. The patient, herself is a poor historian, with known history of Alzheimer's.  Preventive Screening-Counseling & Management  Alcohol-Tobacco     Smoking Status: quit     Year Quit: 1980  Current Medications (verified): 1)  Amlodipine Besylate 5 Mg Tabs (Amlodipine Besylate) .... Take 1 Tablet By Mouth Once A Day 2)  Simvastatin 20 Mg Tabs (Simvastatin) .... Take 1 Tablet By Mouth Two Times A Day 3)  Vitamin B-12 100 Mcg Tabs (Cyanocobalamin) .... Take 1  Tablet By Mouth Once A Day 4)  Oscal 500/200 D-3 500-200 Mg-Unit Tabs (Calcium Carbonate-Vitamin D) .... Take 1 Tablet By Mouth Two Times A Day 5)  Humalog Mix 75/25 75-25 % Susp (Insulin Lispro Prot & Lispro) .... Use As Directed 6)  Aspir-Low 81 Mg Tbec (Aspirin) .... Take 1 Tablet By Mouth Once A Day 7)  Isosorbide Mononitrate 20 Mg Tabs (Isosorbide Mononitrate) .... Take 1 Tablet By Mouth Two Times A Day 8)  Lisinopril 40 Mg Tabs (Lisinopril) .... Take 1 Tablet By Mouth Once A Day 9)  Omeprazole 20 Mg Cpdr (Omeprazole) .... Take 1 Tablet By Mouth Once A Day 10)  Allopurinol 100 Mg Tabs (Allopurinol) .... Take 1 Tablet By Mouth Once A Day 11)  Dicyclomine Hcl 10 Mg Caps (Dicyclomine Hcl) .... Take 1 Tablet By Mouth Four Times A Day As Needed 12)  Nitrostat 0.4 Mg Subl (Nitroglycerin) .Marland Kitchen.. 1 Tablet Under Tongue At Onset of Chest Pain; You May Repeat Every 5 Minutes For Up To 3 Doses. 13)  Zofran 4 Mg Tabs (Ondansetron Hcl) .... Take One By Mouth Every 6 Hours As Needed 14)  Vitamin B-12 1000 Mcg Tabs (Cyanocobalamin) .... Take 1 Tablet By Mouth Once A Day  Allergies (verified): 1)  ! Ativan 2)  ! Haldol  Comments:  Nurse/Medical Assistant: The patient's medication list and allergies were reviewed with the patient and were updated in the Medication and Allergy Lists.  Past History:  Past Medical History: Last updated: 05/02/2010 MITRAL INSUFFICIENCY (ICD-396.3) HYPERTENSION, UNSPECIFIED (ICD-401.9) CAD, NATIVE VESSEL (ICD-414.01)... treated medically...borderline LAD lesion by cath, 1/09 Diabetes mellitus Chronic diastolic  heart failure...EF 75%; no focal wall motion abns; NL RVF by echo, 1/11 Chronic kidney disease Interstitial lung disease Dyslipidemia Obesity Alzheimer's dementia  Review of Systems       No fevers, chills, hemoptysis, dysphagia, melena, hematocheezia, hematuria, rash, claudication, orthopnea, or pnd, stable, chronic bilateral peripheral edema. All other  systems negative.   Vital Signs:  Patient profile:   77 year old female Height:      60 inches Weight:      211 pounds Pulse rate:   64 / minute BP sitting:   79 / 42  (left arm) Cuff size:   large  Vitals Entered By: Carlye Grippe (October 31, 2010 1:04 PM)  Serial Vital Signs/Assessments:  Time      Position  BP       Pulse  Resp  Temp     By 1:07 PM             109/68                         Carlye Grippe  Comments: 1:07 PM right arm/large cuff By: Carlye Grippe    Physical Exam  Additional Exam:  GEN:77 year old female, obese, sitting in a wheelchair, in no distress HEENT: NCAT,PERRLA,EOMI NECK: palpable pulses, no bruits; no JVD; no TM LUNGS: mild bibasilar crackles, no wheezes HEART: RRR (S1S2), with diminished heart sounds; no significant murmurs; no rubs; no gallops ABD: protuberant, soft, NT; intact BS [id number], 2+ bilateral, nonpitting edema SKIN: warm, dry MUSC: no obvious deformity NEURO: A/O (x3)     EKG  Procedure date:  10/31/2010  Findings:      first degree AV block at 66 bpm; LAD; question old IMI; poor R wave progression  Impression & Recommendations:  Problem # 1:  CAD, NATIVE VESSEL (ICD-414.01)  Will reattempt risk stratification with a dobutamine stress echocardiogram, to rule out occult ischemia. Patient has been hospitalized on 2 recent occasions, each time ruling out for MI. She has a history of nonobstructive CAD by catheterization in 2009, preceded by a false positive Cardiolite. We prefer to defer cardiac catheterization, given her underlying chronic kidney disease.  Problem # 2:  BRADYCARDIA (ICD-427.89)  recently evaluated by our team at [facility], and she was not felt to be a candidate for permanent pacemaker implantation. She was diagnosed with sick sinus syndrome, and had evidence of junctional escape at 50 bpm. She is currently not on any SA/AV nodal blockers.  Problem # 3:  HYPOTENSION (ICD-458.9)  patient has  unequal blood pressure readings ( 79/42 left; 109/68 right). Therefore, we'll schedule carotid Doppler's to rule out possible left SCA stenosis. Additionally, we'll discontinue ACE inhibitor (NL LVF/CRI), and decrease amlodipine to 2.5 mg daily. We'll arrange early clinic followup with myself/Dr. Andee Lineman, for review of results and further recommendations regarding medical management.  Problem # 4:  CHRONIC DIASTOLIC HEART FAILURE (ICD-428.32)  continue to monitor closely and reassess at time of next visit. Currently not on a diuretic. Will reconsider this, pending review of results of metabolic profile.  Other Orders: EKG w/ Interpretation (93[address]) Echo- Dobutamine (Dobutamine Echo) Carotid Duplex (Carotid Duplex) T-Basic Metabolic Panel ([id number]) T-CBC No Diff ([id number])  Patient Instructions: 1)  Follow up with Gene on Thursday, November 21, 2010 at 1:20pm. 2)  Stop Lisinopril. 3)  Decrease Norvasc (amlodipine) to 2.5mg  by mouth once daily. 4)  Your physician recommends that you go to the [facility] for lab work: 5)  Your  physician has requested that you have a carotid duplex. This test is an ultrasound of the carotid arteries in your neck. It looks at blood flow through these arteries that supply the brain with blood. Allow one hour for this exam. There are no restrictions or special instructions. 6)  Your physician has requested that you have a dobutamine echocardiogram.  For further information please visit [email or web address]  Please follow instruction sheet as given.  I have reviewed and approved all prescriptions at the time of the office visit. Nelida Meuse, PA-C  October 31, 2010 2:12 PM  Appended Document: [facility]     Allergies: 1)  ! Ativan 2)  ! Haldol  Prescriptions: AMLODIPINE BESYLATE 2.5 MG TABS (AMLODIPINE BESYLATE) Take one tablet by mouth daily  #30 x 6   Entered by:   Cyril Loosen, RN, BSN   Authorized by:   Nelida Meuse,  PA-C   Signed by:   Cyril Loosen, RN, BSN on 10/31/2010   Method used:   Electronically to        [facility]* (retail)       [address]       [place], [place]  [address]       Ph: [phone]       Fax: [phone]   RxID:   [id number]

## 2010-12-12 NOTE — Letter (Signed)
Summary: [facility] D[facility]C DR. VYAS  [facility]C DR. [facility]YAS   Imported By: Zachary George 10/31/2010 11:32:54  _____________________________________________________________________  External Attachment:    Type:   Image     Comment:   External Document

## 2010-12-12 NOTE — Consult Note (Signed)
Summary: Consultation Report  Consultation Report   Imported By: Dorise Hiss 10/31/2010 11:59:50  _____________________________________________________________________  External Attachment:    Type:   Image     Comment:   External Document

## 2010-12-12 NOTE — Miscellaneous (Signed)
Summary: update med list   Clinical Lists Changes  Medications: Removed medication of FUROSEMIDE 20 MG TABS (FUROSEMIDE) Take one tablet by mouth daily.

## 2010-12-12 NOTE — Consult Note (Signed)
Summary: CARDIOLOGY CONSULT/ [facility]  CARDIOLOGY CONSULT/ [facility]   Imported By: Zachary George 10/31/2010 11:40:39  _____________________________________________________________________  External Attachment:    Type:   Image     Comment:   External Document

## 2011-01-12 ENCOUNTER — Emergency Department (HOSPITAL_COMMUNITY): Payer: Medicare Other

## 2011-01-12 ENCOUNTER — Inpatient Hospital Stay (HOSPITAL_COMMUNITY)
Admission: EM | Admit: 2011-01-12 | Discharge: 2011-01-13 | DRG: 057 | Disposition: A | Discharge status: Home Health | Payer: Medicare Other | Attending: Family Medicine | Admitting: Family Medicine

## 2011-01-12 DIAGNOSIS — T443X5A Adverse effect of other parasympatholytics [anticholinergics and antimuscarinics] and spasmolytics, initial encounter: Secondary | ICD-10-CM | POA: Diagnosis present

## 2011-01-12 DIAGNOSIS — R627 Adult failure to thrive: Secondary | ICD-10-CM | POA: Diagnosis present

## 2011-01-12 DIAGNOSIS — Z882 Allergy status to sulfonamides status: Secondary | ICD-10-CM

## 2011-01-12 DIAGNOSIS — G309 Alzheimer's disease, unspecified: Principal | ICD-10-CM | POA: Diagnosis present

## 2011-01-12 DIAGNOSIS — F0280 Dementia in other diseases classified elsewhere without behavioral disturbance: Principal | ICD-10-CM | POA: Diagnosis present

## 2011-01-12 DIAGNOSIS — Z9181 History of falling: Secondary | ICD-10-CM

## 2011-01-12 DIAGNOSIS — T424X5A Adverse effect of benzodiazepines, initial encounter: Secondary | ICD-10-CM | POA: Diagnosis present

## 2011-01-12 DIAGNOSIS — M109 Gout, unspecified: Secondary | ICD-10-CM | POA: Diagnosis present

## 2011-01-12 DIAGNOSIS — Y92009 Unspecified place in unspecified non-institutional (private) residence as the place of occurrence of the external cause: Secondary | ICD-10-CM

## 2011-01-12 DIAGNOSIS — I10 Essential (primary) hypertension: Secondary | ICD-10-CM | POA: Diagnosis present

## 2011-01-12 DIAGNOSIS — K219 Gastro-esophageal reflux disease without esophagitis: Secondary | ICD-10-CM | POA: Diagnosis present

## 2011-01-12 DIAGNOSIS — E785 Hyperlipidemia, unspecified: Secondary | ICD-10-CM | POA: Diagnosis present

## 2011-01-12 DIAGNOSIS — F19921 Other psychoactive substance use, unspecified with intoxication with delirium: Secondary | ICD-10-CM | POA: Diagnosis present

## 2011-01-12 DIAGNOSIS — E7800 Pure hypercholesterolemia, unspecified: Secondary | ICD-10-CM | POA: Diagnosis present

## 2011-01-12 DIAGNOSIS — Z79899 Other long term (current) drug therapy: Secondary | ICD-10-CM

## 2011-01-12 DIAGNOSIS — I2510 Atherosclerotic heart disease of native coronary artery without angina pectoris: Secondary | ICD-10-CM | POA: Diagnosis present

## 2011-01-12 DIAGNOSIS — Z794 Long term (current) use of insulin: Secondary | ICD-10-CM

## 2011-01-12 DIAGNOSIS — Z66 Do not resuscitate: Secondary | ICD-10-CM | POA: Diagnosis present

## 2011-01-12 DIAGNOSIS — Z7982 Long term (current) use of aspirin: Secondary | ICD-10-CM

## 2011-01-12 DIAGNOSIS — I509 Heart failure, unspecified: Secondary | ICD-10-CM | POA: Diagnosis present

## 2011-01-12 DIAGNOSIS — E119 Type 2 diabetes mellitus without complications: Secondary | ICD-10-CM | POA: Diagnosis present

## 2011-01-12 DIAGNOSIS — Z87891 Personal history of nicotine dependence: Secondary | ICD-10-CM

## 2011-01-12 LAB — CBC
HCT: 38.2 % (ref 36.0–46.0)
Hemoglobin: 13.1 g/dL (ref 12.0–15.0)
WBC: 5.6 10*3/uL (ref 4.0–10.5)

## 2011-01-12 LAB — BRAIN NATRIURETIC PEPTIDE: Pro B Natriuretic peptide (BNP): 57 pg/mL (ref 0.0–100.0)

## 2011-01-12 LAB — URINE MICROSCOPIC-ADD ON

## 2011-01-12 LAB — URINALYSIS, ROUTINE W REFLEX MICROSCOPIC
Bilirubin Urine: NEGATIVE
Glucose, UA: NEGATIVE mg/dL
Nitrite: NEGATIVE
Specific Gravity, Urine: 1.027 (ref 1.005–1.030)
pH: 6 (ref 5.0–8.0)

## 2011-01-12 LAB — BASIC METABOLIC PANEL
CO2: 26 mEq/L (ref 19–32)
Chloride: 98 mEq/L (ref 96–112)
Glucose, Bld: 158 mg/dL — ABNORMAL HIGH (ref 70–99)
Potassium: 4 mEq/L (ref 3.5–5.1)
Sodium: 136 mEq/L (ref 135–145)

## 2011-01-12 LAB — GLUCOSE, CAPILLARY: Glucose-Capillary: 142 mg/dL — ABNORMAL HIGH (ref 70–99)

## 2011-01-12 LAB — DIFFERENTIAL
Basophils Absolute: 0 10*3/uL (ref 0.0–0.1)
Lymphocytes Relative: 14 % (ref 12–46)
Neutro Abs: 4.4 10*3/uL (ref 1.7–7.7)

## 2011-01-13 DIAGNOSIS — I5023 Acute on chronic systolic (congestive) heart failure: Secondary | ICD-10-CM

## 2011-01-13 DIAGNOSIS — R4182 Altered mental status, unspecified: Secondary | ICD-10-CM

## 2011-01-13 LAB — POCT CARDIAC MARKERS

## 2011-01-13 LAB — URINE CULTURE: Culture: NO GROWTH

## 2011-01-13 LAB — GLUCOSE, CAPILLARY: Glucose-Capillary: 176 mg/dL — ABNORMAL HIGH (ref 70–99)

## 2011-01-13 LAB — LIPID PANEL
Cholesterol: 104 mg/dL (ref 0–200)
HDL: 44 mg/dL (ref >39)
LDL Cholesterol: 33 mg/dL (ref 0–99)
Total CHOL/HDL Ratio: 2.4 RATIO
Triglycerides: 133 mg/dL (ref <150)

## 2011-01-13 LAB — COMPREHENSIVE METABOLIC PANEL
ALT: 12 U/L (ref 0–35)
Calcium: 9.2 mg/dL (ref 8.4–10.5)
Glucose, Bld: 121 mg/dL — ABNORMAL HIGH (ref 70–99)
Sodium: 137 mEq/L (ref 135–145)
Total Protein: 6.4 g/dL (ref 6.0–8.3)

## 2011-01-13 LAB — CBC
HCT: 36.1 % (ref 36.0–46.0)
MCHC: 33.2 g/dL (ref 30.0–36.0)
RDW: 13.2 % (ref 11.5–15.5)

## 2011-01-13 LAB — TSH: TSH: 1.128 u[IU]/mL (ref 0.350–4.500)

## 2011-01-20 NOTE — Discharge Summary (Signed)
NAMEJENEANE, [name]                  ACCOUNT NO.:  [id number]  MEDICAL RECORD NO.:  [id number]           PATIENT TYPE:  E  LOCATION:  [facility]                         FACILITY:  [facility]  PHYSICIAN:  Leighton Roach Rolen Conger, M.D.DATE OF BIRTH:  Aug 15, 1924  DATE OF ADMISSION:  01/12/2011 DATE OF DISCHARGE:  01/13/2011                              DISCHARGE SUMMARY   PRIMARY CARE PROVIDER:  Dr. Sherril Croon in [place], [place].  DISCHARGE DIAGNOSES: 1. Dementia, end stage. 2. Failure to thrive. 3. Hypertension. 4. Diabetes. 5. History of coronary artery disease.  DISCHARGE MEDICATIONS: 1. Remeron 7.5 mg p.o. nightly. 2. Allopurinol 100 mg p.o. q.a.m. 3. Amlodipine 2.5 mg p.o. daily. 4. Aspirin 81 mg p.o. daily. 5. Os-Cal 500/200 one tab p.o. b.i.d. 6. Humalog mixed 75/25 subcu b.i.d. sliding scale. 7. Isosorbide mononitrate 20 mg 1 tab p.o. b.i.d. 8. Omeprazole 20 mg p.o. daily. 9. Simvastatin 20 mg p.o. b.i.d. 10.Vitamin B12 1000 mcg 1 tab p.o. daily.  DISCONTINUED MEDICATIONS: 1. Dicyclomine 10 mg p.o. b.i.d. 2. Xanax.  PERTINENT LABORATORY VALUES:  On January 12, 2011, CBC; white blood cell count 5.6, hemoglobin 13.1, hematocrit 38.2, platelets 142.  January 13, 2011, fasting lipid panel; total cholesterol 104, triglycerides 133, HDL 44, LDL 33, VLDL 27, ratio 2.4, hemoglobin A1c is 6.7.  Thyroid- stimulating hormone 1.128.  RADIOLOGY:  On January 12, 2011, a CT of the head without contrast shows atrophy and chronic ischemic changes, no acute abnormality.  Hip x-ray, 2-view, no acute osseous abnormality identified; suboptimal penetration secondary to pannus projecting over the hips.  Chest x-ray, 1-view portable, shows mild CHF with interstitial pulmonary edema.  BRIEF HOSPITAL COURSE:  [name] is an 77 year old female with end- stage dementia who presented to the hospital after being found down by her family and had 3 days of increasing confusion prior to this. Problems: 1. Fall.  The  patient had several x-rays and no acute fractures were     found.  Her pain was controlled with Tylenol. 2. Confusion.  The patient was evaluated for an infection-causing     confusion, cardiac event, or worsening renal failure.  None of     these seemed to be the cause of her worsening confusion.  It was     felt that it was likely worsened by polypharmacy including Xanax     and dicyclomine.  These medications were discontinued during this     hospitalization.  The family was counseled that the patient's     dementia is most likely end-stage and that we would expect her     mental status to get worse, not better.  They were understanding of     this and wanted to continue to care for their mother at home and     not place her in a skilled nursing facility. 3. Failure to thrive.  The patient has a very poor appetite which is     likely secondary to her end-stage dementia.  She was started on     Remeron during this hospitalization to increase her appetite.  She     was  started on a small dose due to her age.  We also suggested to     the family that using nutritional supplements such as Boost or     Ensure shakes could be beneficial to [name].  FOLLOWUP ISSUES/RECOMMENDATIONS:  The patient should follow up with her primary care provider as needed.  DISCHARGE CONDITION:  The patient was discharged home in stable medical condition.    ______________________________ Ardyth Gal, MD   ______________________________ Leighton Roach Jayme Mednick, M.D.    CR/MEDQ  D:  01/13/2011  T:  01/14/2011  Job:  [id number]  cc:   Dr. Sherril Croon  Electronically Signed by Ardyth Gal MD on 01/19/2011 12:23:27 PM Electronically Signed by Acquanetta Belling M.D. on 01/20/2011 02:20:43 PM

## 2011-01-21 LAB — BASIC METABOLIC PANEL
BUN: 14 mg/dL (ref 6–23)
BUN: 19 mg/dL (ref 6–23)
BUN: 24 mg/dL — ABNORMAL HIGH (ref 6–23)
CO2: 30 mEq/L (ref 19–32)
Calcium: 9.1 mg/dL (ref 8.4–10.5)
Calcium: 9.5 mg/dL (ref 8.4–10.5)
Chloride: 103 mEq/L (ref 96–112)
GFR calc non Af Amer: 43 mL/min — ABNORMAL LOW (ref >60)
GFR calc non Af Amer: 43 mL/min — ABNORMAL LOW (ref >60)
GFR calc non Af Amer: 40 mL/min — ABNORMAL LOW (ref >60)
Glucose, Bld: 113 mg/dL — ABNORMAL HIGH (ref 70–99)
Glucose, Bld: 100 mg/dL — ABNORMAL HIGH (ref 70–99)
Potassium: 4.1 mEq/L (ref 3.5–5.1)
Potassium: 4.5 mEq/L (ref 3.5–5.1)
Sodium: 138 mEq/L (ref 135–145)
Sodium: 143 mEq/L (ref 135–145)
Sodium: 139 mEq/L (ref 135–145)

## 2011-01-21 LAB — GLUCOSE, CAPILLARY
Glucose-Capillary: 109 mg/dL — ABNORMAL HIGH (ref 70–99)
Glucose-Capillary: 121 mg/dL — ABNORMAL HIGH (ref 70–99)
Glucose-Capillary: 122 mg/dL — ABNORMAL HIGH (ref 70–99)
Glucose-Capillary: 146 mg/dL — ABNORMAL HIGH (ref 70–99)
Glucose-Capillary: 147 mg/dL — ABNORMAL HIGH (ref 70–99)
Glucose-Capillary: 152 mg/dL — ABNORMAL HIGH (ref 70–99)
Glucose-Capillary: 165 mg/dL — ABNORMAL HIGH (ref 70–99)
Glucose-Capillary: 177 mg/dL — ABNORMAL HIGH (ref 70–99)
Glucose-Capillary: 188 mg/dL — ABNORMAL HIGH (ref 70–99)
Glucose-Capillary: 199 mg/dL — ABNORMAL HIGH (ref 70–99)

## 2011-01-21 LAB — URINALYSIS, ROUTINE W REFLEX MICROSCOPIC
Bilirubin Urine: NEGATIVE
Hgb urine dipstick: NEGATIVE
Ketones, ur: NEGATIVE mg/dL
Nitrite: NEGATIVE
Specific Gravity, Urine: >1.03 — ABNORMAL HIGH (ref 1.005–1.030)
pH: 5.5 (ref 5.0–8.0)

## 2011-01-21 LAB — LEGIONELLA ANTIGEN, URINE: Legionella Antigen, Urine: NEGATIVE

## 2011-01-21 LAB — CK TOTAL AND CKMB (NOT AT ARMC)
Relative Index: INVALID (ref 0.0–2.5)
Total CK: 34 U/L (ref 7–177)

## 2011-01-21 LAB — LACTATE DEHYDROGENASE: LDH: 141 U/L (ref 94–250)

## 2011-01-21 LAB — DIFFERENTIAL
Basophils Absolute: 0 10*3/uL (ref 0.0–0.1)
Basophils Absolute: 0 10*3/uL (ref 0.0–0.1)
Basophils Relative: 1 % (ref 0–1)
Basophils Relative: 0 % (ref 0–1)
Eosinophils Absolute: 0.5 10*3/uL (ref 0.0–0.7)
Eosinophils Relative: 11 % — ABNORMAL HIGH (ref 0–5)
Eosinophils Relative: 10 % — ABNORMAL HIGH (ref 0–5)
Monocytes Absolute: 0.4 10*3/uL (ref 0.1–1.0)
Monocytes Absolute: 0.4 10*3/uL (ref 0.1–1.0)
Monocytes Relative: 9 % (ref 3–12)
Neutro Abs: 2.5 10*3/uL (ref 1.7–7.7)

## 2011-01-21 LAB — CLOSTRIDIUM DIFFICILE BY PCR

## 2011-01-21 LAB — CBC
HCT: 32.4 % — ABNORMAL LOW (ref 36.0–46.0)
HCT: 32.6 % — ABNORMAL LOW (ref 36.0–46.0)
HCT: 32.4 % — ABNORMAL LOW (ref 36.0–46.0)
Hemoglobin: 10.6 g/dL — ABNORMAL LOW (ref 12.0–15.0)
Hemoglobin: 10.9 g/dL — ABNORMAL LOW (ref 12.0–15.0)
Hemoglobin: 10.9 g/dL — ABNORMAL LOW (ref 12.0–15.0)
MCH: 33.9 pg (ref 26.0–34.0)
MCH: 35.4 pg — ABNORMAL HIGH (ref 26.0–34.0)
MCHC: 32.7 g/dL (ref 30.0–36.0)
MCHC: 33.5 g/dL (ref 30.0–36.0)
MCHC: 33.8 g/dL (ref 30.0–36.0)
MCV: 103.5 fL — ABNORMAL HIGH (ref 78.0–100.0)
MCV: 104.7 fL — ABNORMAL HIGH (ref 78.0–100.0)
Platelets: 125 10*3/uL — ABNORMAL LOW (ref 150–400)
RDW: 12.7 % (ref 11.5–15.5)
RDW: 12.9 % (ref 11.5–15.5)
RDW: 12.9 % (ref 11.5–15.5)
RDW: 13.8 % (ref 11.5–15.5)
WBC: 3.2 10*3/uL — ABNORMAL LOW (ref 4.0–10.5)

## 2011-01-21 LAB — RENAL FUNCTION PANEL
Albumin: 2.9 g/dL — ABNORMAL LOW (ref 3.5–5.2)
CO2: 25 mEq/L (ref 19–32)
Calcium: 9.1 mg/dL (ref 8.4–10.5)
GFR calc Af Amer: >60 mL/min (ref >60)
GFR calc non Af Amer: 53 mL/min — ABNORMAL LOW (ref >60)
Phosphorus: 3.2 mg/dL (ref 2.3–4.6)
Sodium: 135 mEq/L (ref 135–145)

## 2011-01-21 LAB — POCT CARDIAC MARKERS: CKMB, poc: <1 ng/mL — ABNORMAL LOW (ref 1.0–8.0)

## 2011-01-21 LAB — HEMOCCULT GUIAC POC 1CARD (OFFICE): Fecal Occult Bld: NEGATIVE

## 2011-01-21 LAB — MRSA PCR SCREENING: MRSA by PCR: NEGATIVE

## 2011-01-21 LAB — APTT: aPTT: 32 seconds (ref 24–37)

## 2011-01-21 LAB — TROPONIN I: Troponin I: 0.02 ng/mL (ref 0.00–0.06)

## 2011-01-26 LAB — POCT CARDIAC MARKERS
CKMB, poc: <1 ng/mL — ABNORMAL LOW (ref 1.0–8.0)
CKMB, poc: <1 ng/mL — ABNORMAL LOW (ref 1.0–8.0)
Troponin i, poc: <0.05 ng/mL (ref 0.00–0.09)
Troponin i, poc: <0.05 ng/mL (ref 0.00–0.09)

## 2011-01-26 LAB — BASIC METABOLIC PANEL
CO2: 28 mEq/L (ref 19–32)
CO2: 28 mEq/L (ref 19–32)
Calcium: 9.1 mg/dL (ref 8.4–10.5)
Calcium: 9.4 mg/dL (ref 8.4–10.5)
Chloride: 104 mEq/L (ref 96–112)
Creatinine, Ser: 1.49 mg/dL — ABNORMAL HIGH (ref 0.4–1.2)
GFR calc Af Amer: 40 mL/min — ABNORMAL LOW (ref >60)
GFR calc Af Amer: 40 mL/min — ABNORMAL LOW (ref >60)
Glucose, Bld: 179 mg/dL — ABNORMAL HIGH (ref 70–99)
Potassium: 4.5 mEq/L (ref 3.5–5.1)
Sodium: 138 mEq/L (ref 135–145)
Sodium: 140 mEq/L (ref 135–145)

## 2011-01-26 LAB — CBC
HCT: 34.9 % — ABNORMAL LOW (ref 36.0–46.0)
HCT: 34.7 % — ABNORMAL LOW (ref 36.0–46.0)
Hemoglobin: 10.6 g/dL — ABNORMAL LOW (ref 12.0–15.0)
Hemoglobin: 11.6 g/dL — ABNORMAL LOW (ref 12.0–15.0)
MCHC: 33.4 g/dL (ref 30.0–36.0)
MCHC: 33.8 g/dL (ref 30.0–36.0)
MCHC: 33.6 g/dL (ref 30.0–36.0)
MCV: 103.9 fL — ABNORMAL HIGH (ref 78.0–100.0)
MCV: 103.9 fL — ABNORMAL HIGH (ref 78.0–100.0)
Platelets: 126 10*3/uL — ABNORMAL LOW (ref 150–400)
RBC: 3.02 MIL/uL — ABNORMAL LOW (ref 3.87–5.11)
RBC: 3.38 MIL/uL — ABNORMAL LOW (ref 3.87–5.11)
RDW: 14.5 % (ref 11.5–15.5)
WBC: 4 10*3/uL (ref 4.0–10.5)

## 2011-01-26 LAB — DIFFERENTIAL
Basophils Absolute: 0 10*3/uL (ref 0.0–0.1)
Basophils Relative: 0 % (ref 0–1)
Eosinophils Relative: 7 % — ABNORMAL HIGH (ref 0–5)
Lymphocytes Relative: 27 % (ref 12–46)
Lymphocytes Relative: 26 % (ref 12–46)
Lymphs Abs: 1.3 10*3/uL (ref 0.7–4.0)
Lymphs Abs: 1 10*3/uL (ref 0.7–4.0)
Monocytes Absolute: 0.5 10*3/uL (ref 0.1–1.0)
Monocytes Absolute: 0.5 10*3/uL (ref 0.1–1.0)
Monocytes Absolute: 0.4 10*3/uL (ref 0.1–1.0)
Monocytes Relative: 12 % (ref 3–12)
Monocytes Relative: 9 % (ref 3–12)
Neutro Abs: 1.9 10*3/uL (ref 1.7–7.7)
Neutro Abs: 2.8 10*3/uL (ref 1.7–7.7)
Neutro Abs: 2.2 10*3/uL (ref 1.7–7.7)
Neutrophils Relative %: 47 % (ref 43–77)

## 2011-01-26 LAB — COMPREHENSIVE METABOLIC PANEL
AST: 25 U/L (ref 0–37)
Albumin: 3.3 g/dL — ABNORMAL LOW (ref 3.5–5.2)
BUN: 22 mg/dL (ref 6–23)
Calcium: 9.7 mg/dL (ref 8.4–10.5)
Chloride: 103 mEq/L (ref 96–112)
Creatinine, Ser: 1.26 mg/dL — ABNORMAL HIGH (ref 0.4–1.2)
GFR calc Af Amer: 49 mL/min — ABNORMAL LOW (ref >60)
Total Bilirubin: 0.4 mg/dL (ref 0.3–1.2)
Total Protein: 7.1 g/dL (ref 6.0–8.3)

## 2011-01-26 LAB — LIPID PANEL
Cholesterol: 96 mg/dL (ref 0–200)
HDL: 35 mg/dL — ABNORMAL LOW (ref >39)
HDL: 43 mg/dL (ref >39)
LDL Cholesterol: 35 mg/dL (ref 0–99)
LDL Cholesterol: 37 mg/dL (ref 0–99)
Total CHOL/HDL Ratio: 2.7 RATIO
Triglycerides: 103 mg/dL (ref <150)
Triglycerides: 90 mg/dL (ref <150)
VLDL: 21 mg/dL (ref 0–40)

## 2011-01-26 LAB — CARDIAC PANEL(CRET KIN+CKTOT+MB+TROPI)
CK, MB: 0.9 ng/mL (ref 0.3–4.0)
CK, MB: 1 ng/mL (ref 0.3–4.0)
Relative Index: INVALID (ref 0.0–2.5)
Relative Index: INVALID (ref 0.0–2.5)
Relative Index: INVALID (ref 0.0–2.5)
Total CK: 39 U/L (ref 7–177)
Total CK: 41 U/L (ref 7–177)
Troponin I: 0.02 ng/mL (ref 0.00–0.06)

## 2011-01-26 LAB — GLUCOSE, CAPILLARY
Glucose-Capillary: 171 mg/dL — ABNORMAL HIGH (ref 70–99)
Glucose-Capillary: 253 mg/dL — ABNORMAL HIGH (ref 70–99)

## 2011-01-27 IMAGING — CR DG LUMBAR SPINE COMPLETE 4+V
6 series · 6 of 6 positions shown · non-contrast
Comparison: None available.

CLINICAL DATA: Hyperglycemia.  Low back pain.

LUMBAR SPINE - COMPLETE 4+ VIEW

[t l-spine lat]
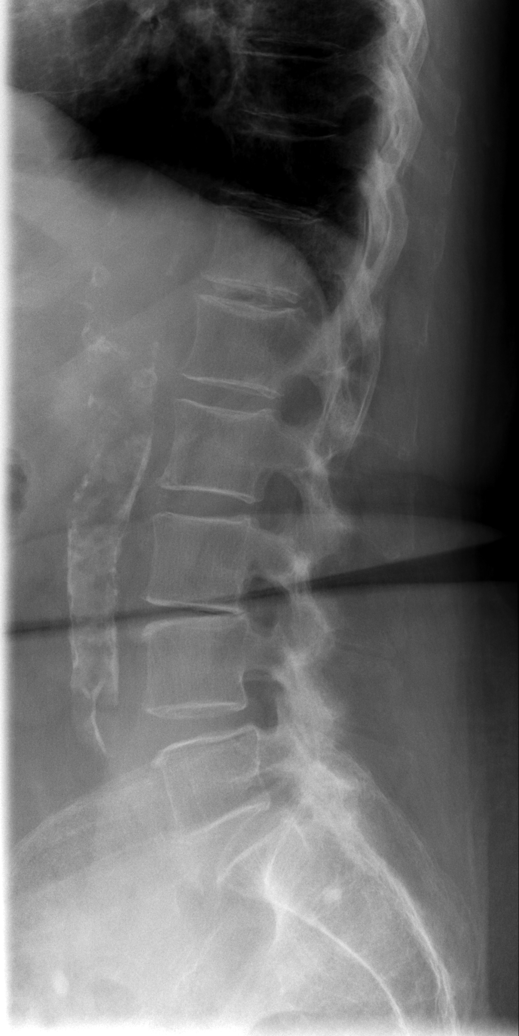

[t l-spine l5-s1 spot]
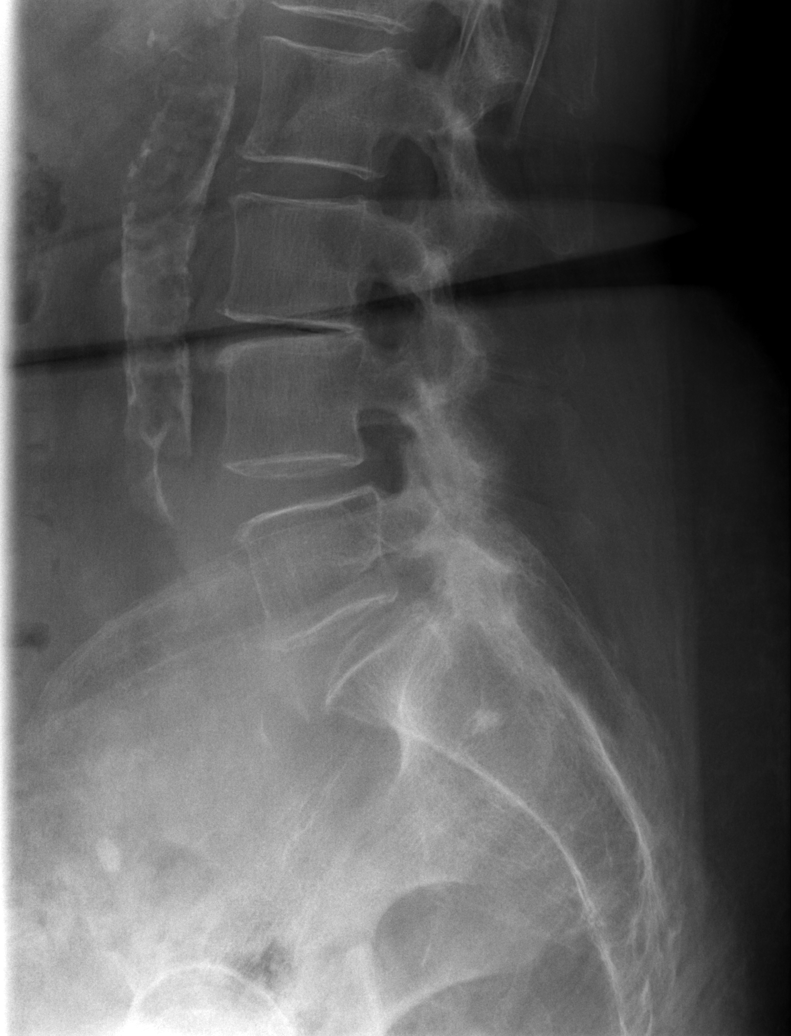

[t l-spine oblique exposure]
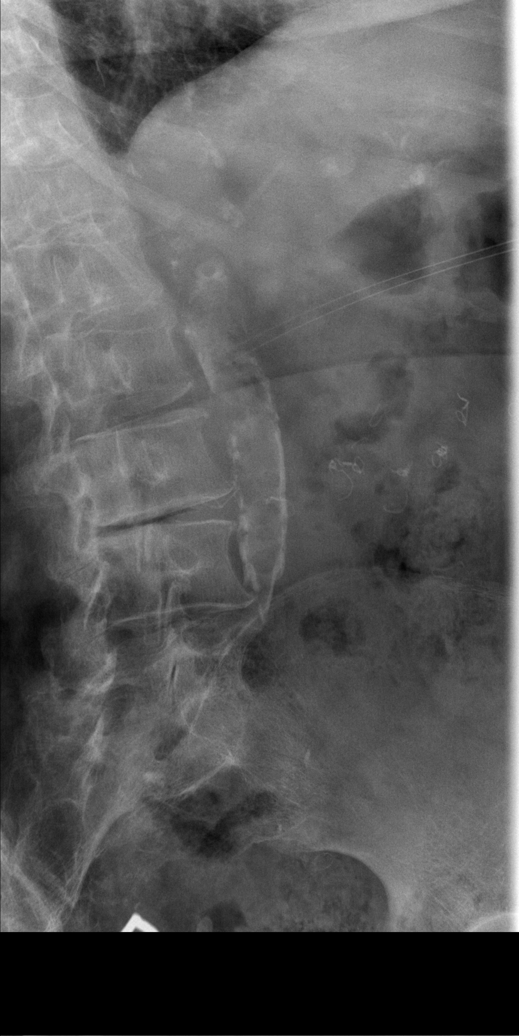

[t l-spine oblique exposure * (1 of 2)]
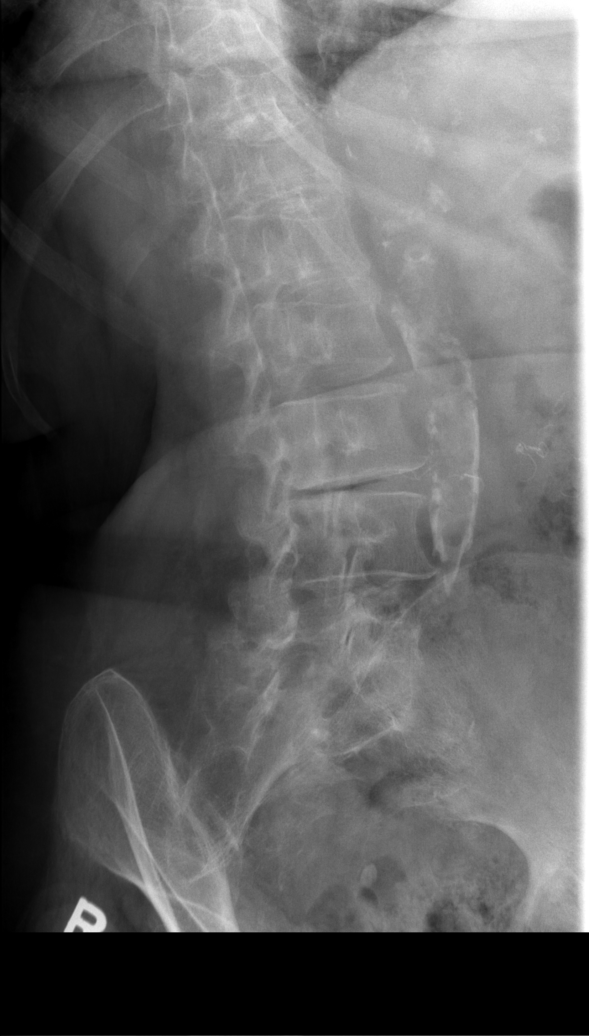

[t l-spine a.p.]
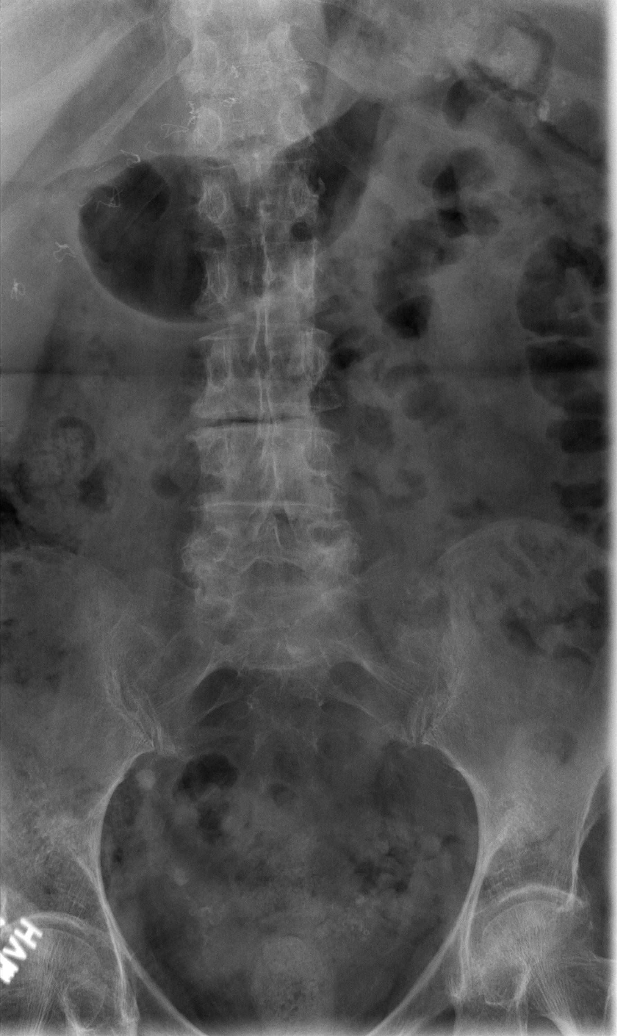

[t l-spine oblique exposure * (2 of 2)]
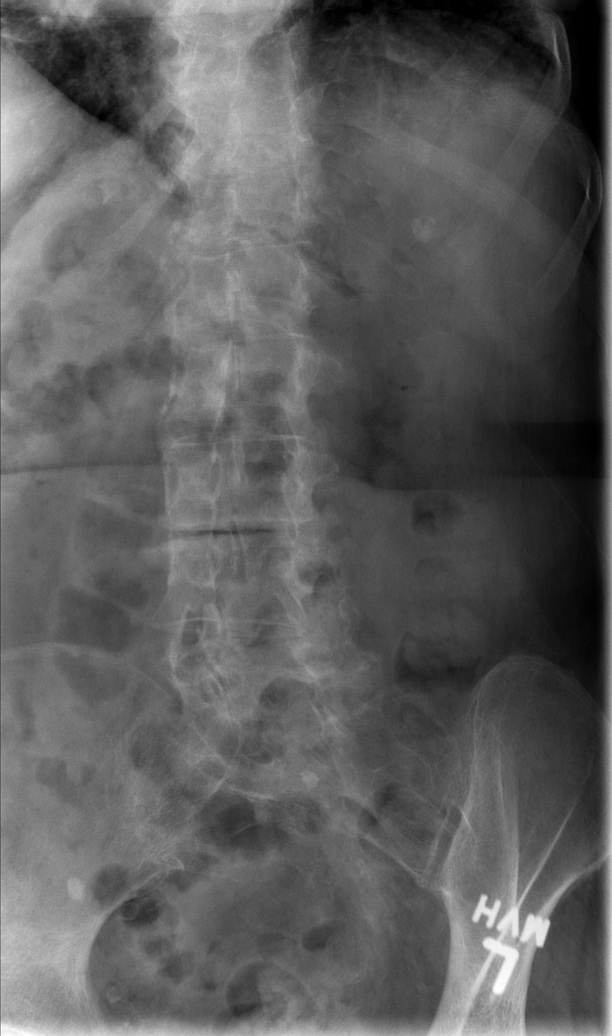

[6 of 6 positions shown; findings below may reference images not displayed]

FINDINGS: Five non-rib bearing lumbar type vertebral bodies are
present.  There is slight degenerative anterolisthesis at L3-4.
Facet degenerative changes are seen bilaterally at L4-5 and L5-S1.
Atherosclerotic calcifications are noted in the aorta.
Degenerative changes are noted in the hips and SI joints
bilaterally.
IMPRESSION: 1.  Multilevel degenerative changes of the lumbar spine.
2.  No acute abnormality.

## 2011-01-27 IMAGING — CR DG CHEST 2V
2 series · 2 of 2 positions shown · non-contrast
Comparison: 08/09/2007

CLINICAL DATA: Hyperglycemia.  Diabetes.

CHEST - 2 VIEW

[w chest lat *]
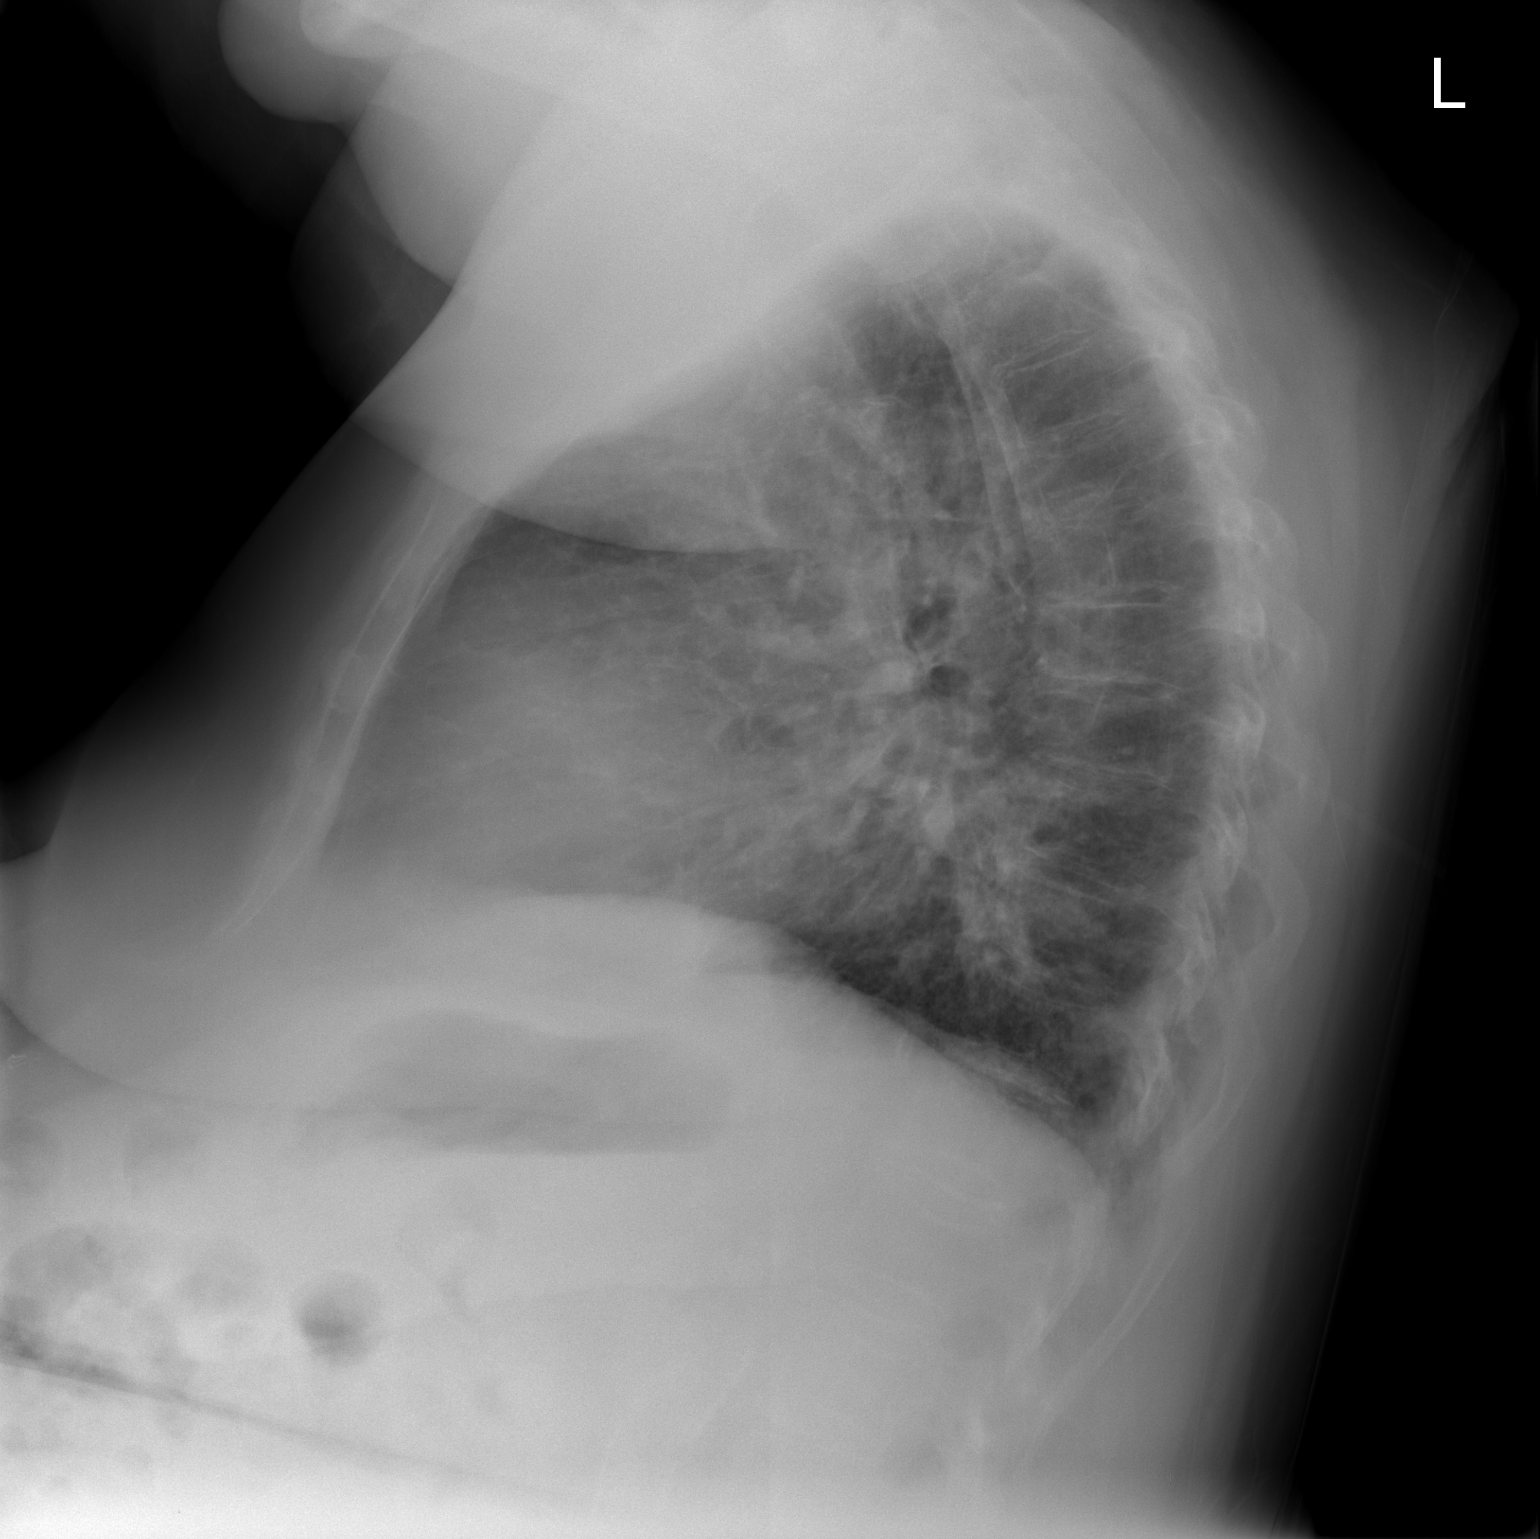

[view not recorded]
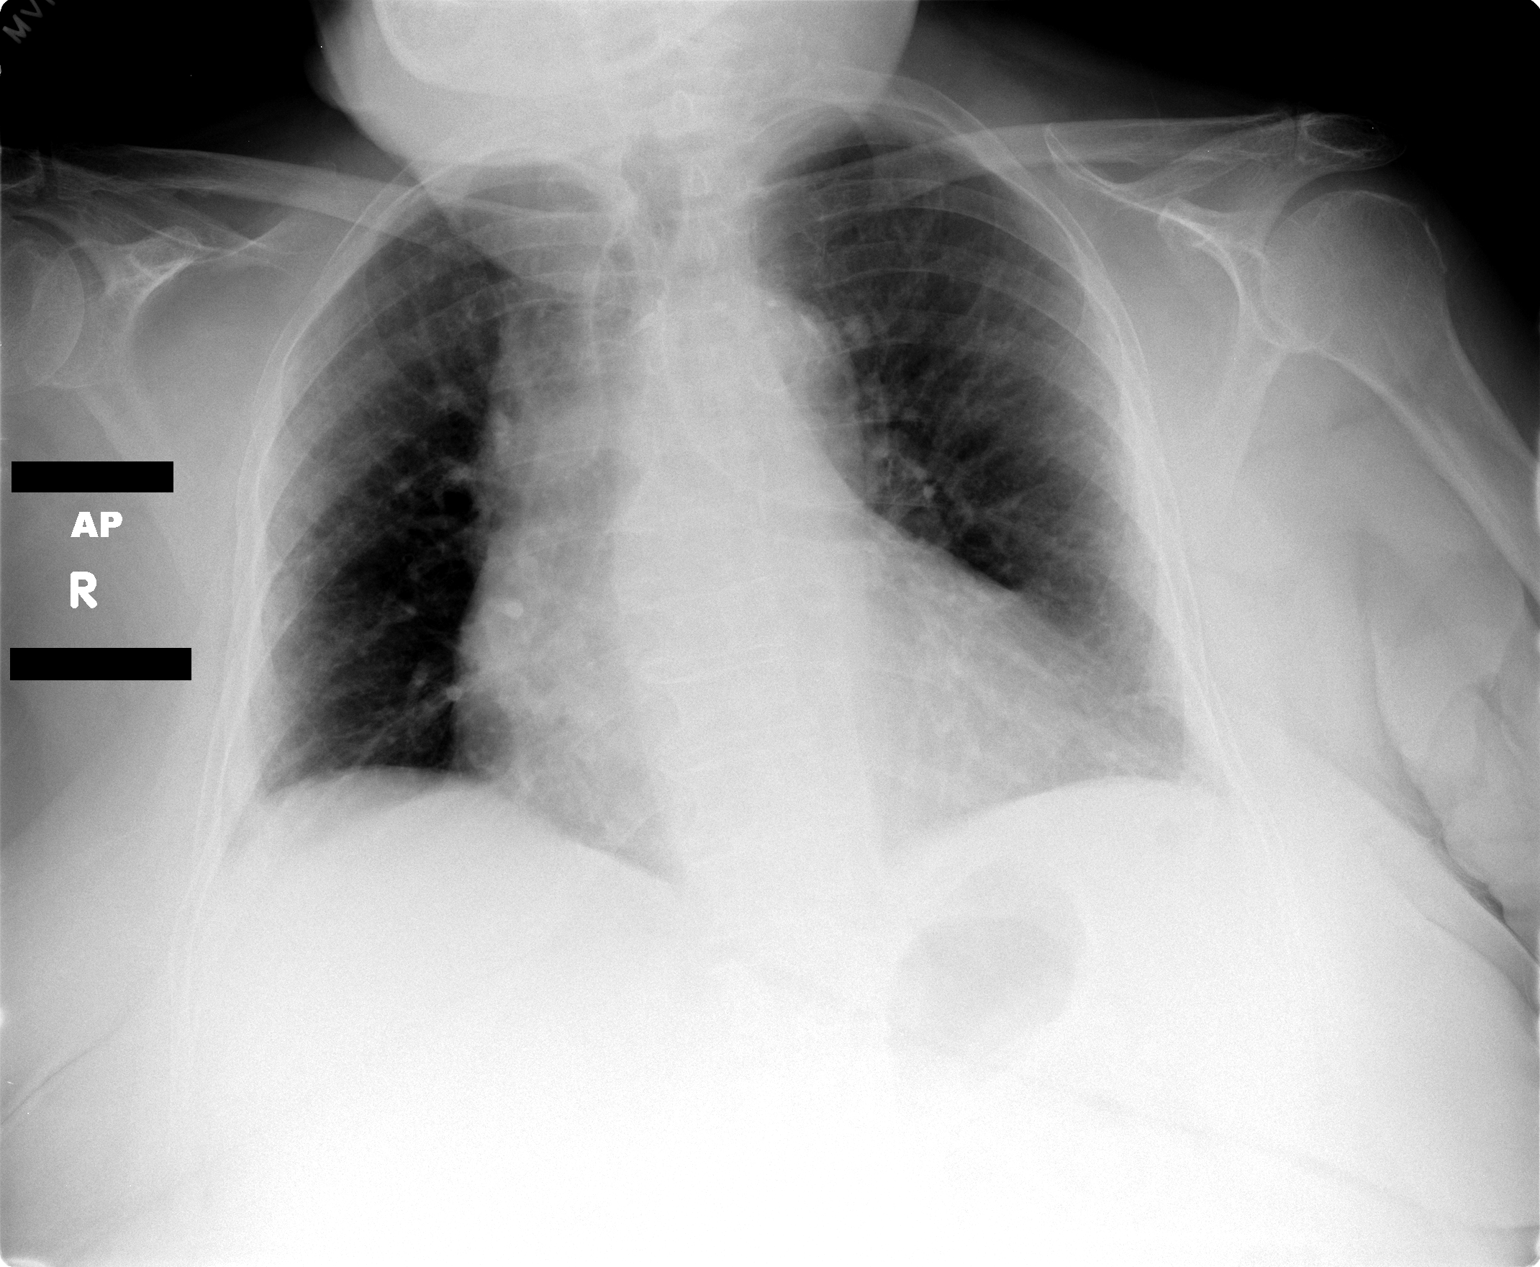

[2 of 2 positions shown; findings below may reference images not displayed]

FINDINGS: The heart size is enlarged.

There is no pleural effusion or pulmonary edema.

The lung volumes are low.

No airspace densities identified.
IMPRESSION: 1.  No acute cardiopulmonary abnormalities.
2.  Cardiac enlargement.

## 2011-01-29 LAB — URINALYSIS, ROUTINE W REFLEX MICROSCOPIC
Bilirubin Urine: NEGATIVE
Glucose, UA: NEGATIVE mg/dL
Hgb urine dipstick: NEGATIVE
Ketones, ur: NEGATIVE mg/dL
Nitrite: NEGATIVE
Specific Gravity, Urine: 1.02 (ref 1.005–1.030)
pH: 5 (ref 5.0–8.0)

## 2011-01-29 LAB — CBC
HCT: 39.6 % (ref 36.0–46.0)
MCV: 102.1 fL — ABNORMAL HIGH (ref 78.0–100.0)
RBC: 3.88 MIL/uL (ref 3.87–5.11)
RDW: 13.2 % (ref 11.5–15.5)
WBC: 8.9 10*3/uL (ref 4.0–10.5)

## 2011-01-29 LAB — COMPREHENSIVE METABOLIC PANEL
ALT: 16 U/L (ref 0–35)
AST: 26 U/L (ref 0–37)
Albumin: 3.2 g/dL — ABNORMAL LOW (ref 3.5–5.2)
CO2: 28 mEq/L (ref 19–32)
Calcium: 9.6 mg/dL (ref 8.4–10.5)
GFR calc Af Amer: 44 mL/min — ABNORMAL LOW (ref >60)
GFR calc non Af Amer: 36 mL/min — ABNORMAL LOW (ref >60)
Sodium: 138 mEq/L (ref 135–145)

## 2011-01-29 LAB — URINE MICROSCOPIC-ADD ON

## 2011-01-29 LAB — DIFFERENTIAL
Basophils Absolute: 0 10*3/uL (ref 0.0–0.1)
Lymphocytes Relative: 14 % (ref 12–46)
Lymphs Abs: 1.2 10*3/uL (ref 0.7–4.0)
Neutro Abs: 6.2 10*3/uL (ref 1.7–7.7)
Neutrophils Relative %: 70 % (ref 43–77)

## 2011-02-11 NOTE — H&P (Signed)
[name], [name]                  ACCOUNT NO.:  [id number]  MEDICAL RECORD NO.:  [id number]           PATIENT TYPE:  E  LOCATION:  [facility]                         FACILITY:  [facility]  PHYSICIAN:  Pearlean Brownie, M.D.DATE OF BIRTH:  [birth year]  DATE OF ADMISSION:  01/12/2011 DATE OF DISCHARGE:                             HISTORY & PHYSICAL   PRIMARY CARE PHYSICIAN:  Dr. Sherril Croon of [facility].  PRIMARY CARDIOLOGIST:  The patient also has Dr. Andee Lineman of [facility] as primary cardiologist.  CHIEF COMPLAINT:  Weakness, failure to thrive, and fall.  HISTORY OF PRESENT ILLNESS:  This is an 77 year old female with a history of coronary artery disease, hypertension, diabetes, bradycardia, admission back in November of C. diff colitis, who is presenting with weakness, failure to thrive, and found down at home.  The patient lives with her son as well as has home health nurse already.  The last 3 days, the patient has had increasing confusion and not acting like herself as well as not eating even to the last 2 days of not eating any food whatsoever because she is either forgotten or she states she is hungry at inopportune times per son.  The patient this morning was found on the floor in her room and found some feces, son then at that time called EMS and had the patient brought here to be evaluated.  During this time of confusion over the last 3 days, no new medications, but have been giving her a little more increase of Xanax that the patient has for a nightly medication during the day due to some shaking events.  Son states that she might had some facial droop and might had some word slurring, but also has been fairly dry by mouth, he does not know for sure.  Denies any type of fevers, chills, nausea, vomiting, diarrhea, or constipation leading up to this.  Has had a chronic abdominal pain that she has had previously and no new findings.  The patient though has not been walking as much as  she used to, actually not walking at all per the patient's power of attorney, who is her daughter by the name of [name] pretty much for the last 3 days here.  ALLERGIES:  The patient is allergic to AMBIEN, ATIVAN, HALDOL, LEVAQUIN, SULFA, AND ZOLOFT.  PAST MEDICAL HISTORY: 1. Coronary artery disease. 2. Bradycardia. 3. C. diff colitis back in November 2011. 4. Gout. 5. Dementia. 6. Diabetes. 7. Hyperlipidemia. 8. Hypertension.  PAST SURGICAL HISTORY: 1. She has had a cholecystectomy. 2. Bilateral tubal ligation. 3. Tonsillectomy. 4. Appendectomy. 5. The patient did have a cardiac catheterization done in 2009 that     showed a 70%-75% left anterior descending stenosis.  MEDICATIONS:  The patient is taking 1. Simvastatin 20 mg twice daily. 2. Isosorbide mononitrate 20 mg b.i.d. 3. Humalog insulin 75/25 on a sliding scale. 4. Aspirin 81 mg daily. 5. Allopurinol 100 mg daily. 6. Calcium carbonate, home dose not known, daily. 7. Omeprazole 20 mg daily. 8. Zofran 4 mg p.o. p.r.n. for nausea. 9. Vitamin B12 1000 mg p.o. daily. 10.Amlodipine 2.5 mg p.o.  daily. 11.Xanax 0.25 mg at night and p.r.n. per daughter.  SOCIAL HISTORY:  The patient quit smoking back in 1986.  No alcohol or illicit drugs.  Mother died during childbirth.  FAMILY HISTORY:  Unremarkable.  The patient does have 11 children.  PHYSICAL EXAM:  VITAL SIGNS:  Temperature 98.3, pulse 56, respirations 16, blood pressure 145/72, and 98% on room air. GENERAL:  The patient was generally pleasant.  No apparent distress. Hard of hearing.  Alert and oriented times zero. HEENT:  Pupils were equal, round, reactive to light, and accommodation. NECK:  Supple.  No lymphadenopathy or masses noted. CV:  Had distant heart sounds.  Regular rate and rhythm at this time. LUNGS:  Crackles throughout.  No focal findings.  No change. ABDOMEN:  Bowel sounds positive, nontender, obese. EXTREMITIES:  +1 edema to midcalf and mostly in  the sacral region. NEURO:  4/5 strength bilaterally.  Cranial nerves II through XII intact.  LABS AND STUDIES: 1. The patient had a hip x-ray done while here showed no acute osseous     abnormalities. 2. The patient had a head CT done here today as well, has no acute     abnormalities just chronic changes. 3. Chest x-ray did show some mild CHF changes. 4. EKG shows normal sinus rhythm with some PACs. 5. UA showed many squams, positive blood, greater than 300 protein. 6. The patient had a BMET done with a sodium of 136, potassium of 4.0,     chloride of 98, bicarb of 26, BUN of 15, creatinine of 1.16.  The     patient's baseline creatinine appears to be 1.6.  Glucose 1.58.     The patient's CBC had a white blood cell count of 5.6, hemoglobin     of 13.1, hematocrit of 34.1, and platelets of 142.  The patient's     platelets at last admission was 145.  ASSESSMENT AND PLAN:  This is an 77 year old female, who is presenting with weakness and failure to thrive. 1. Confusion and dementia.  This is likely due to more of her weakness     and failure to thrive than anything else.  The patient appears to     be an end-stage dementia at this time.  Talked to [name], her POA,     with the number to get reach her at [phone] as well as her son [name]     who is at [phone] about the patient's prognosis at this time and     the patient was made do not resuscitate/do not intubate,     understands that she has end-stage renal dementia, but they would     not like to consider placement for her and would still rather at     the time of discharge do home health and anything of this nature.     Told them we have a look for other possibilities for this     confusion.  First, we will get a thyroid fasting lipid panel, keep     on tele unit to make sure that there is no arrhythmia likely these     could be chronic ischemic events that are occurring as well.  I did     not feel that an MRI of the brain would  show [place] anything traumatic     and would not without any focal findings on clinical exam either,     and we would not change our management in this setting.  We  will     continue though the simvastatin as well as the aspirin during this     time for proper treatment.  The patient used to be on an ACE, could     consider starting that as well.  We will stop any medications that     could be causing the confusion as well.  Starting stopping the     dicyclomine as well as the Xanax to see if there is any     improvement. 2. Failure to thrive.  Once again likely secondary to her end-stage     dementia.  We will get a MBS which is modified barium swallow study     and then start Remeron 7.5 mg to see if it could increase appetite,     hopefully, this will not make the patient too tired at that lower     dose. 3. Diabetes.  We will do an insulin sliding scale. 4. Hypertension.  We will continue the patient's isosorbide     mononitrate as well as amlodipine.  The patient seems to be fairly     well tolerating that.  If the patient goes too low, we will     consider stopping the amlodipine. 5. Hyperlipidemia, on simvastatin.  We will continue the patient's     same regimen. 6. Congestive heart failure.  The patient has a history of some mild     congestive heart failure.  The chest x-ray showing that this could     be contributing a little bit of it.  The patient also had some     lower extremity and sacral edema.  We will try one dose of Lasix     with a Foley catheter in place to see if that improves any of the     patient's response at this time.  Once again likely not in acute     change at this point though. 7. FEN/GI.  We will saline lock IV.  We will get a modified barium     swallow study as well as an advanced diet to modified. 8. Prophylaxis with heparin and proton pump inhibitor. 9. Code status, do not resuscitate/do not intubate.  This is on the     chart as well and should be  followed with the patient from here or     now. 10.Disposition.  We talked to the son and daughter again.  Daughter     [name] is the one who is her power of attorney.  On discharge of     this patient, we would consider going back home.  The patient     already has home health nurse, consider getting home health PT and     other factors in play, but they would like to continue this at this     point.  I talked to them about a possible goals of care meeting     during this stay, they would be open to it, but do not feel that     this would change any of their management and I have to agree at     this point.  They are very realistic that this was likely the end-     stage dementia and the patient does have less than 6 months to live     and they just want to make sure that she does not survive.     Antoine Primas, DO   ______________________________ Pearlean Brownie, M.D.  ZS/MEDQ  D:  01/12/2011  T:  01/13/2011  Job:  [id number]  Electronically Signed by Antoine Primas  on 01/17/2011 05:56:15 PM Electronically Signed by Pearlean Brownie M.D. on 02/11/2011 03:41:26 PM

## 2011-02-17 LAB — DIFFERENTIAL
Basophils Relative: 1 % (ref 0–1)
Eosinophils Absolute: 0.7 10*3/uL (ref 0.0–0.7)
Eosinophils Absolute: 0.7 10*3/uL (ref 0.0–0.7)
Eosinophils Relative: 17 % — ABNORMAL HIGH (ref 0–5)
Lymphocytes Relative: 14 % (ref 12–46)
Lymphs Abs: 1 10*3/uL (ref 0.7–4.0)
Lymphs Abs: 0.8 10*3/uL (ref 0.7–4.0)
Lymphs Abs: 1 10*3/uL (ref 0.7–4.0)
Monocytes Relative: 12 % (ref 3–12)
Monocytes Relative: 8 % (ref 3–12)
Monocytes Relative: 12 % (ref 3–12)
Neutro Abs: 2 10*3/uL (ref 1.7–7.7)
Neutro Abs: 3.9 10*3/uL (ref 1.7–7.7)
Neutrophils Relative %: 53 % (ref 43–77)
Neutrophils Relative %: 66 % (ref 43–77)
Neutrophils Relative %: 46 % (ref 43–77)

## 2011-02-17 LAB — URINALYSIS, ROUTINE W REFLEX MICROSCOPIC
Bilirubin Urine: NEGATIVE
Glucose, UA: NEGATIVE mg/dL
Glucose, UA: NEGATIVE mg/dL
Glucose, UA: NEGATIVE mg/dL
Hgb urine dipstick: NEGATIVE
Hgb urine dipstick: NEGATIVE
Hgb urine dipstick: NEGATIVE
Ketones, ur: NEGATIVE mg/dL
Nitrite: NEGATIVE
Protein, ur: NEGATIVE mg/dL
Specific Gravity, Urine: 1.021 (ref 1.005–1.030)
Specific Gravity, Urine: 1.01 (ref 1.005–1.030)
Specific Gravity, Urine: 1.013 (ref 1.005–1.030)
Urobilinogen, UA: 0.2 mg/dL (ref 0.0–1.0)
Urobilinogen, UA: 0.2 mg/dL (ref 0.0–1.0)
pH: 5 (ref 5.0–8.0)
pH: 5 (ref 5.0–8.0)
pH: 5.5 (ref 5.0–8.0)

## 2011-02-17 LAB — CBC
HCT: 34.7 % — ABNORMAL LOW (ref 36.0–46.0)
Hemoglobin: 12 g/dL (ref 12.0–15.0)
Hemoglobin: 12.6 g/dL (ref 12.0–15.0)
MCHC: 34.6 g/dL (ref 30.0–36.0)
MCHC: 34 g/dL (ref 30.0–36.0)
MCV: 103.7 fL — ABNORMAL HIGH (ref 78.0–100.0)
Platelets: 102 10*3/uL — ABNORMAL LOW (ref 150–400)
Platelets: 101 10*3/uL — ABNORMAL LOW (ref 150–400)
RBC: 3.35 MIL/uL — ABNORMAL LOW (ref 3.87–5.11)
RBC: 3.54 MIL/uL — ABNORMAL LOW (ref 3.87–5.11)
RDW: 13.9 % (ref 11.5–15.5)
WBC: 5.9 10*3/uL (ref 4.0–10.5)

## 2011-02-17 LAB — COMPREHENSIVE METABOLIC PANEL
ALT: 20 U/L (ref 0–35)
AST: 39 U/L — ABNORMAL HIGH (ref 0–37)
Albumin: 3.1 g/dL — ABNORMAL LOW (ref 3.5–5.2)
Alkaline Phosphatase: 64 U/L (ref 39–117)
BUN: 30 mg/dL — ABNORMAL HIGH (ref 6–23)
CO2: 25 mEq/L (ref 19–32)
Calcium: 8.9 mg/dL (ref 8.4–10.5)
Calcium: 9.3 mg/dL (ref 8.4–10.5)
GFR calc Af Amer: 35 mL/min — ABNORMAL LOW (ref >60)
GFR calc non Af Amer: 29 mL/min — ABNORMAL LOW (ref >60)
Glucose, Bld: 236 mg/dL — ABNORMAL HIGH (ref 70–99)
Glucose, Bld: 247 mg/dL — ABNORMAL HIGH (ref 70–99)
Potassium: 4.4 mEq/L (ref 3.5–5.1)
Sodium: 136 mEq/L (ref 135–145)
Sodium: 135 mEq/L (ref 135–145)
Total Protein: 6.7 g/dL (ref 6.0–8.3)
Total Protein: 7.2 g/dL (ref 6.0–8.3)

## 2011-02-17 LAB — URINE CULTURE
Colony Count: 50000
Culture: NO GROWTH

## 2011-02-17 LAB — URINE MICROSCOPIC-ADD ON

## 2011-02-17 LAB — GLUCOSE, CAPILLARY
Glucose-Capillary: 213 mg/dL — ABNORMAL HIGH (ref 70–99)
Glucose-Capillary: 262 mg/dL — ABNORMAL HIGH (ref 70–99)

## 2011-02-17 LAB — POCT CARDIAC MARKERS: Myoglobin, poc: 158 ng/mL (ref 12–200)

## 2011-02-17 LAB — BASIC METABOLIC PANEL
Chloride: 94 mEq/L — ABNORMAL LOW (ref 96–112)
Creatinine, Ser: 1.35 mg/dL — ABNORMAL HIGH (ref 0.4–1.2)
GFR calc Af Amer: 45 mL/min — ABNORMAL LOW (ref >60)
GFR calc non Af Amer: 37 mL/min — ABNORMAL LOW (ref >60)

## 2011-02-17 LAB — BRAIN NATRIURETIC PEPTIDE: Pro B Natriuretic peptide (BNP): <30 pg/mL (ref 0.0–100.0)

## 2011-03-25 NOTE — Assessment & Plan Note (Signed)
[facility]                          [facility] OFFICE NOTE   NAME:[name], [name]                       MRN:          [id number]  DATE:11/17/2007                            DOB:          [birth year]    REFERRING PHYSICIAN:  Dhruv Vyas   HISTORY OF PRESENT ILLNESS:  The patient is a 77 year old female with  multiple medical problems status post myocardial infarction September  2005.  Her last catheterization was in 2006 when she was found to have  single-vessel coronary artery disease with moderately severe LAD  disease.  She did have a 75% LAD lesion.  It was felt, given her medical  comorbidities, she should be treated medically.  More recently, the  patient has been complaining of a substernal burning sensation which  she relates to meals.  However, according to her daughter, this occurs  at various times, and sometimes she becomes somewhat diaphoretic with  this.  There is no associated shortness of breath.  She does have a  history of reflux and uses Prilosec.  She denies any orthopnea, PND.  She is limiting her activities due to her dementia and gait instability,  and she uses a cane.  She has severe Alzheimer's disease and has great  difficulty recalling events.  According to her daughter, however, the  patient has been increasingly short of breath on minimal exertion.   MEDICATIONS:  1. Prilosec 20 mg p.o. daily.  2. Lidocaine patch.  3. Humalog.  4. Allopurinol.  5. Lisinopril 40 mg a day.  6. Isosorbide 60 mg p.o. daily.  7. Calcium.  8. Vitamin B12.  9. Aspirin 81 mg p.o. daily.   Of note, the patient was also hospitalized in December of 2008 with  increased shortness of breath and chest pain.  She was under the service  of Dr. Sherril Croon.  She was ruled out for myocardial infarction and outpatient  cardiac evaluation was planned.  The patient has now been referred for  this evaluation.  On reviewing her note, however, also stated  that the  patient had a chest x-ray, which showed cardiomegaly and interstitial  lung disease.   PHYSICAL EXAMINATION:  VITAL SIGNS:  Blood pressure 124/79, heart rate  is 72, weight 206 pounds.  NECK:  Exam shows normal carotid upstroke.  No carotid bruits.  LUNGS:  Clear breath sounds bilaterally.  HEART:  Regular rate and rhythm.  Normal S1, S2.  No murmurs, rubs, or  gallops.  ABDOMEN:  Soft and nontender.  No rebound or guarding.  Good bowel  sounds.  EXTREMITIES:  No cyanosis, clubbing.  There is peripheral pitting edema.   PROBLEM LIST:  1. Coronary artery disease.      a.     Status post myocardial infarction 2005.      b.     Status post catheterization in 2006 with tandem 75% lesions       to the LAD and an ejection fraction of 55%.  2. Rule out interstitial lung disease by recent chest x-ray.  3. New echographic changes with Q waves in the anterior  and lateral      leads consistent with old anterior wall myocardial infarction.  4. Diabetes.  5. Hypertension.   PLAN:  1. I suspect the patient's symptoms could well represent angina.  I      have given a prescription for p.r.n. nitroglycerin.  More      importantly, the patient will need an echographic study to see if      she indeed had a silent myocardial infarction of the anterior wall      based on echocardiogram.  She also will proceed with Cardiolite      stress testing and, if this shows residual ischemia, she may be      considered for cardiac catheterization.  2. The dyspnea could be secondary to her underlying ischemic heart      disease, but also, given her x-ray findings, could be secondary to      chronic lung disease.  Further recommendations will follow pending      review of her Cardiolite imaging study.  3. I told the patient if she has persistent pain that does not resolve      with several nitroglycerin, she will need to be admitted to the      emergency room.  4. I also see that the patient is not  on lipid lowering therapy, and      will ask Dr. Sherryll Burger to strongly consider Lipitor or other Statin drug      therapy.     Learta Codding, MD,FACC  Electronically Signed    GED/MedQ  DD: 11/17/2007  DT: 11/17/2007  Job #: [id number]   cc:   Doreen Beam, MD

## 2011-03-25 NOTE — Assessment & Plan Note (Signed)
[facility]                          [facility] NOTE   NAME:[name], [name]                       MRN:          [id number]  DATE:11/26/2007                            DOB:          1924-10-10    Addendum to the [facility] office note from November 17, 2007.  That  note as well as this addendum will serve as an admission H&P.   ADDENDUM:  I had [name] come back to the office today in followup to  an abnormal Cardiolite stress study.  The patient was seen in the office  by me on November 17, 2007 when she reported a sensation of indigestion as  well as substernal chest pain at rest concerning for angina.  The  patient has a known history of single-vessel coronary artery disease  with a proximal 75% LAD lesion.  I referred the patient for Cardiolite  stress study which showed a moderate-large anterior defect which was  fully reversible consistent with LAD disease.  The patient reports 2  days ago in the morning at rest, she had sudden onset of indigestion  for which she took nitroglycerin with resolution of her symptoms.  In  light of the current abnormal Cardiolite study and the patient's  symptoms, I discussed with the patient today the need for her to proceed  with cardiac catheterization.  She was placed on Imdur previously with  some improvement in her symptomatology.  The patient is here today in  the office with her daughter.  I have discussed the risk and benefit of  a diagnostic  catheterization.  They understand the risk and benefit,  and they are willing to proceed.  Given the somewhat unstable nature of  the patient's symptoms, I opted actually to admit her to the hospital  today.  This will also allow [place] to hydrate her as she is at high risk  for renal insufficiency secondary to contrast nephropathy.  Her  creatinine is currently 1.5 and a catheterization without ventriculogram  will need to be performed (coronary angiogram  only).  The patient has  appropriate IV hydration orders written for the night before her  procedure.  I have also placed the patient on Lovenox in addition to her  aspirin.  I have dosed Lovenox accordingly to her renal insufficiency to  be followed by pharmacy at [facility].   Please refer to my note from November 17, 2007 regarding past medical  history as well as her medication list.  Of note is that the patient's  family and social history is unchanged since prior note.  The patient  does not have significant family history of coronary disease.  Social is  notable for the fact the patient does not smoke and lives with her  daughter.   The patient has a bed arranged on 3700 and will be monitored over the  weekend.     Learta Codding, MD,FACC  Electronically Signed    GED/MedQ  DD: 11/26/2007  DT: 11/26/2007  Job #: [id number]   cc:   Doreen Beam, MD

## 2011-03-25 NOTE — Cardiovascular Report (Signed)
[name], [name]                  ACCOUNT NO.:  [id number]   MEDICAL RECORD NO.:  [id number]           PATIENT TYPE:   LOCATION:                                 FACILITY:   PHYSICIAN:  Bevelyn Buckles. Bensimhon, MDDATE OF BIRTH:  1924/08/11   DATE OF PROCEDURE:  11/29/2007  DATE OF DISCHARGE:                            CARDIAC CATHETERIZATION   REFERRING PHYSICIAN:  Dr. Lewayne Bunting.   PATIENT IDENTIFICATION:  [name], a very pleasant 77 year old woman  with history of diabetes, hypertension, chronic renal insufficiency and  dementia.  She also has morbid obesity.  She underwent cardiac  catheterization in 2006 which showed a 70% to 75% LAD lesion which was  treated medically.  She was admitted with chest pain with predominantly  atypical features.  She was referred for cardiac catheterization.   PROCEDURES PERFORMED:  1. Selective coronary angiography.  2. Left heart catheterization.   DESCRIPTION OF PROCEDURE:  The risks and indication were explained.  Consent was signed, placed on the chart.  A 5-French arterial sheath was  placed in the right femoral artery using a modified Seldinger technique.  JL-4 and JR-4 catheters were used for the procedure.  All catheter  changes made over wire.  There no apparent complications.  A left  ventriculogram was not done in order to limit contrast exposure.   Total contrast used was 45 mL.   Central aortic pressure was 146/58, mean was 96.  LV pressure was 150/5  with an EDP of 14.  There is no aortic stenosis.   Left main was normal.   LAD was a heavily calcified vessel.  There is a 45% to 50% ostial lesion  followed by a 30% lesion.  This was followed by a 75% calcified lesion  before the take-off of a large diagonal.  After the diagonal, there was  a 70% lesion in the LAD.  The remainder of the LAD had some mild  nondiffuse disease.  In the ostium of the large diagonal, there was a  60% to 70% lesion.  There was a 75% lesion in the  midsection of the  diagonal at a bifurcation.   Left circumflex gave off a small OM-1, moderate-sized OM-2, and a large  posterolateral branch there was a 30% lesion in the midsection followed  by a 50% lesion in the mid AV groove circ.  There is a 60% lesion in the  circ at the take-off of the OM-2.  In the ostium of the OM-2, there is a  75% to 80% lesion; however, this was a fairly small to moderate vessel.   Right coronary artery was a large dominant vessel.  It gave off a  marginal branch, a large PDA and several posterolaterals.  There is a  50% to 60% lesion in the proximal RCA followed by a 30% lesion in the  midsection, and a 30% lesion distally.  There is mild diffuse plaquing  throughout otherwise.   ASSESSMENT:  Three-vessel mostly nonobstructive disease with a  borderline lesion in the left anterior descending.   PLAN/DISCUSSION:  Her LAD lesion  is similar to the angiograms in 2006.  I suspect medical therapy may be her best option, especially in light of  heavy calcification and a high risk of restenosis.  I will review with  Dr. Riley Kill.  Could consider a possible Myoview to assess the area for  ischemia.      Bevelyn Buckles. Bensimhon, MD  Electronically Signed     DRB/MEDQ  D:  11/29/2007  T:  11/29/2007  Job:  [id number]   cc:   Learta Codding, MD,FACC
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
## 2011-03-25 NOTE — Assessment & Plan Note (Signed)
[facility]                          [facility] NOTE   NAME:[name], [name]                       MRN:          [id number]  DATE:12/16/2007                            DOB:          Aug 11, 1924    HISTORY OF PRESENT ILLNESS:  Patient is a 77 year old female with a  history of significant coronary artery disease.  Patient also has  question of interstitial lung disease by chest x-ray.  She reported  angina on November 17, 2007, and was referred for Cardiolite stress test.  This demonstrated ischemia in the LAD distribution.  She was then  referred for catheterization.  The patient was found to have multivessel  coronary artery disease with a borderline lesion in the LAD.  Given the  high risk for restenosis, due to heavy calcification, a decision was  made to treat the patient medically.  Since that time, she has been  taking Imdur with good clinical response.  Patient actually denies any  substernal chest pain.  She denies any orthopnea or PND.  She denies any  palpitations or syncope.   MEDICATIONS:  1. Fexofenadine 180 mg p.o. b.i.d.  2. Allopurinol 100 mg p.o. daily.  3. Lisinopril 40 mg p.o. daily.  4. B12 1000 mg p.o. daily.  5. Aspirin 81 mg p.o. daily.  6. Calcium.  7. Amlodipine 5 mg p.o. daily.  8. Omeprazole 20 mg p.o. daily.  9. Isosorbide 20 mg p.o. b.i.d.  10.Simvastatin 20 mg p.o. daily.  11.Lidocaine patch.  12.Humalog.   PHYSICAL EXAMINATION:  VITAL SIGNS:  Blood pressure 144/76, heart rate  72, weight is 210 pounds.  NECK EXAM:  Normal carotid upstroke and no carotid bruits.  LUNGS:  Clear breath sounds bilaterally.  HEART:  Regular rate and rhythm, normal S1 and S2, no murmurs, rubs or  gallops.  ABDOMEN:  Soft.  EXTREMITY EXAM:  Without cyanosis, clubbing or edema.   PROBLEM LIST:  1. Coronary artery disease.      a.     Status post recent catheterization.      b.     Multivessel coronary artery disease with  borderline LAD       lesions.      c.     Positive Cardiolite with LAD ischemia.      d.     Angina, controlled with Imdur.  2. Hypertension, controlled.  3. Diabetes mellitus.  4. Mild renal insufficiency.  5. Rule out interstitial lung disease by chest x-ray.   1. The patient is doing well.  I reviewed her laboratory work from      [facility] and she appears to have significant anemia with a      hemoglobin of 10.2.  Her MCV is also significantly elevated.  The      patient may vitamin B12 or folate deficient.  2. Blood work for anemia evaluation, including RBC folate, vitamin      B12, CBC with differential and reticulocyte count and iron and iron      saturation studies.  3. Continue Isordil .  Patient has recurrent angina.  Learta Codding, MD,FACC  Electronically Signed    GED/MedQ  DD: 12/16/2007  DT: 12/17/2007  Job #: [id number]

## 2011-03-25 NOTE — Assessment & Plan Note (Signed)
[facility]                          [facility] NOTE   NAME:Hart, Melinda SUMMA                       MRN:          [id number]  DATE:02/06/2009                            DOB:          Jul 05, 1924    REFERRING PHYSICIAN:  Doreen Beam, MD   HISTORY OF PRESENT ILLNESS:  The patient is an 77 year old female with a  history of coronary artery disease.  The patient also had interstitial  lung disease by chest x-ray.  Apparently, the patient last year  underwent cardiac catheterization and was found to have borderline  lesion and a borderline LAD lesion that he was felt that he should be  treated medically due to the significant amount of calcification.  The  patient has stable angina that is controlled with Imdur.  However, the  patient more recently complains of increased shortness of breath.  She  used to smoke for many years.  Prior x-rays have demonstrated  interstitial lung disease.  I do not see a prior CT scan of the chest.  The patient states that she is short of breath on mild exertion.  She  also has wheezing and frequent cough.  She has no orthopnea or PND.   MEDICATIONS:  1. Allopurinol 100 mg p.o. daily.  2. Lisinopril 40 mg p.o. daily.  3. B12 1000 mcg p.o. daily.  4. Amlodipine 5 mg p.o. daily.  5. Omeprazole 20 mg p.o. b.i.d.  6. Isosorbide 20 mg p.o. b.i.d.  7. Simvastatin 20 mg p.o. daily.  8. Lidocaine patch.  9. Humalog.  10.Calcium.  11.Fexofenadine.  12.Aspirin 81 mg p.o. daily.   PHYSICAL EXAMINATION:  VITAL SIGNS:  Blood pressure is 122/76, heart  rate is 72, weight is 215 pounds.  GENERAL:  Overweight white female in no apparent distress.  HEENT:  Pupils, eyes are clear.  Conjunctivae are clear.  NECK:  Supple.  Normal carotid upstroke and no carotid bruits.  LUNGS:  Diminished breath sounds with crackles at the bases.  HEART:  Regular rate and rhythm with normal S1 and S2 but no murmurs,  rubs, or gallops.  ABDOMEN:  Soft, nontender.  No rebound or guarding.  Good bowel sounds.  EXTREMITY:  No cyanosis, clubbing, or edema.   PROBLEM LIST:  1. Coronary artery disease.      a.     Multivessel coronary artery disease but nonobstructive with       borderline left anterior descending lesions.      b.     Stable angina pattern, controlled with Imdur.  2. Dyspnea, rule out interstitial lung disease.  3. Hypertension, controlled.  4. Diabetes mellitus.  5. Mitral insufficiency.   PLAN:  1. The patient saturation at baseline was stable.  We also had her      walk several minutes in the hall and she did not desaturate.  2. The chest x-ray was reviewed by myself, and indeed, the patient      does appear to have some interstitial lung disease.  We will send      her for pulmonary function test as well as  high-resolution CT scan      of the chest.  At this point, I do not think the patient's problems      are related to worsening ischemic heart disease.  3. The patient does have crackles on exam and aborted the BNP level.     Learta Codding, MD,FACC  Electronically Signed    GED/MedQ  DD: 02/06/2009  DT: 02/07/2009  Job #: [id number]   cc:   Doreen Beam, MD

## 2011-03-25 NOTE — H&P (Signed)
[name], [name]                  ACCOUNT NO.:  [id number]   MEDICAL RECORD NO.:  [id number]          PATIENT TYPE:  EMS   LOCATION:  ED                           [facility]ACILITY[facility]  [facility]   PHYSICIAN:  Herbie Saxon, MDDATE OF BIRTH:  11-14-1923   DATE OF ADMISSION:  08/08/2007  DATE OF DISCHARGE:                              HISTORY & PHYSICAL   CHIEF COMPLAINT:  Abdominal pain, nausea and vomiting, with diarrhea for  1 week.   HISTORY OF PRESENT ILLNESS:  This is an 77 year old Caucasian lady who  was recently discharged from [facility] on August 06, 2007  after a 9 day stay and treated MRSA enteritis. She was brought by the  family today to [facility] today with anorexia,  nausea, vomiting, diarrhea, and weakness.  The patient denies any chest  discomfort. She has mild abdominal cramps. She has a noticeable skin  rash on the legs and chest, which is non-itchy. Not painful. The patient  says the diarrhea is subsiding and there is no active vomiting at  present. She is still nauseated with associated anorexia and fatigue. No  joint swelling. She has bilateral leg swelling, which is chronic.   PAST MEDICAL HISTORY:  Chronic renal insufficiency, coronary artery  disease, diabetes, hyperlipidemia, osteoarthritis, dementia, anemia,  leukopenia, hypertension, CVA, possible obstructive sleep apnea.   PREVIOUS PROCEDURES:  Cholecystectomy, cardiac cath in 2006.   FAMILY HISTORY:  Father had hypertension. Mother died at childbirth.   SOCIAL HISTORY:  She used to smoke 1/2 pack per day for more than 30  years. Quit about 20 years ago. No history of alcohol or drug abuse.   ALLERGIES:  SULAR, HALDOL, LEVAQUIN.   MEDICATIONS:  Omeprazole 20 mg daily, Fexofenadine 180 mg daily,  Allopurinol 100 mg daily, Lisinopril 40 mg daily, Amlodipine 5 mg daily,  Isosorbide mononitrate 30mg  daily, Clotrimazole cream topically vit. B12  1 tablet _ daily, aspirin 81 mg  daily, calcium plus vitamin D 600 mg  t.i.d., Humalog 75/25 28 units a.m. and 24 units q.h.s., Lidoderm 5%  patch dlyDoxycycline 1 daily, Vicodin 1 q.6 hours p.r.n.   PHYSICAL EXAMINATION:  GENERAL:  An elderly lady, obese. Not in acute  respiratory distress.  VITAL SIGNS:  Temperature 98.4, pulse 66, respiratory rate 18, blood  pressure 118/66.  HEENT:  Hard of hearing.  SKIN:  She is clinically not jaundiced. There is no cyanosis or  clubbing.  Pupils equal, round, reactive to light and accommodation.  She is mildly demented.  NECK:  Supple.  CHEST breath sounds and faint rales at the bases.  HEART:  Sounds 1, 2, and 3 regular.  ABDOMEN: She has some mild epigastric tenderness. No organomegaly,  truncal obesity. Normoactive bowel sounds.  There is trace pedal edema .   LABORATORY DATA:  k= 4.2. Glucose is 119. CO2 136, potassium 4.8,  chloride 105, bicarbonate 28, BUN 19, creatinine 1.6.sodium 135   Chest x-ray showed cardiomegaly and pulmonary congestion. No acute  abdominal changes.   EKG shows sinus bradycardia at 58 per minute.   Abdominal x-ray  shows no acute abdominal changes. .   IMPRESSION:  Enteritis with recent MRSA infection, anemia of chronic  disease, bradycardia, acute on chronic renal insufficiency, skin rashes  rule out adverse reaction to antibiotics, and deconditioning.   RECOMMENDATIONS:  The patient is to be admitted to a medical bed. Start  on gentle IV hydration with 1/2 normal saline at 40 cc an hour. Will  resume her home medications. Will reduce the Lisinopril dose to 10 mg  daily, Lasix 20 mg bid, KCL 10 meq daily. Repeat chest x-ray in the a.m.  Obtain thyroid function tests. Consider 2-D echocardiogram in the a.m.  Get her coagulation parameters. Cardiac enzymes and EKG q.8 hours x3.  Start on Flagyl 250 mg p.o. t.i.d., KCL 10 meq daily, hold the  Allopurinol for now, and Lasix 20 mg b.i.d. KCL 10 meq daily p.r.n.      Herbie Saxon,  MD  Electronically Signed     MIO/MEDQ  D:  08/08/2007  T:  08/08/2007  Job:  [id number]

## 2011-03-28 NOTE — Cardiovascular Report (Signed)
[name], [name]                  ACCOUNT NO.:  [id number]   MEDICAL RECORD NO.:  [id number]          PATIENT TYPE:  INP   LOCATION:  4708                         FACILITY:  [facility]   PHYSICIAN:  Vida Roller, M.D.   DATE OF BIRTH:  [birth year]   DATE OF PROCEDURE:  03/14/2005  DATE OF DISCHARGE:                              CARDIAC CATHETERIZATION   PRIMARY CARE PHYSICIAN:  Dr. Eliberto Ivory.   CARDIOLOGIST:  Learta Codding, M.D. [facility].   HISTORY OF PRESENT ILLNESS:  [name] is a 77 year old woman who has known  coronary artery disease, status post a myocardial infarction, in September  2005, who is densely demented.  Concern existed at that time for significant  coronary disease but the decision was made not to pursue invasive assessment  at that time due to her myriad of other medical issues.  She now presents to  the [facility] for evaluation of discomfort in her chest.  She was  found to have an elevated D-dimmer and the thought was to pursue a V/Q scan  with the possibility of subsequently obtaining a cardiac catheterization if  the V/Q scan was negative.  Unfortunately, we were unable to obtain adequate  radiopharmaceutical to perform a V/Q scan, so the decision was made to  proceed with a heart catheterization first in hopes of eliminating the  possibility of coronary disease potentially being the source of her rest  discomfort and she was brought to the cath lab at [facility] for  that evaluation.   DETAILS OF THE PROCEDURE:  After obtaining informed consent from her power-  of-attorney, [name] was brought to the cath lab in a fasting state.  She  was prepped and draped in the usual sterile manner and local anesthetic was  obtained to the right groin using 1% lidocaine without epinephrine.  The  right femoral artery was cannulated using the modified Seldinger technique  with a 6 French 10-cm sheath and left heart catheterization was performed  using a 6  French Judkins left #4, 6 French Judkins right #4, and a 6 French  pigtail catheter.  The pigtail catheter was used for left ventriculography  in the 30 degree REO view.  At the conclusion of the procedure, the  catheters were removed.  The patient was moved back to the cardiology  holding area where the femoral artery sheath was removed.  Hemostasis was  obtained using direct manual pressure.  At the conclusion of the hold, there  was no evidence of ecchymosis or hematoma formation.  Distal pulses were  intact.  Total fluoroscopic time was 4.3 minutes.  Total  contrast 120 cc.   RESULTS:  1.  Aortic pressure 156/63 with a mean of 100-mmHg.  2.  Left ventricular pressure 152/14 with an end diastolic pressure of 21-      mmHg.   CORONARY ANGIOGRAPHY:  1.  The left main coronary artery is a moderate caliber vessel which is      angiographically normal.  2.  The left anterior descending coronary artery is a moderate caliber  vessel which has diffuse disease in its mid portion at the bifurcation      of a moderate caliber bifurcating diagonal branch.  The maximum stenosis      appears to be about 75% in an area that is diffuse throughout about a 20-      to -25-mm segment of the LAD.  3.  The circumflex coronary artery is a moderate caliber vessel which has      some severe distal disease in the posterolateral branching system.  4.  The right coronary artery is a large dominant vessel which has a      moderate caliber posterior descending coronary artery.  There are serial      25% stenoses in the proximal and mid portions of the right coronary      artery but it is otherwise without significant obstructive disease.   LEFT VENTRICULOGRAM:  Reveals an ejection fraction of approximately 55% with  only mild hypokinesis of the anterior apical region.   ASSESSMENT:  1.  Single-vessel coronary disease.  2.  Preserved left ventricular systolic function.   PLAN:  I have reviewed these  films with Dr. Loraine Leriche Hart our  interventional cardiologist who feels that the lesion in the left anterior  descending coronary artery is not likely to the culprit of rest pain.  He  feels that further evaluation including a CT scan of her chest to rule out  pulmonary embolus be done as soon as she can clear most of the dye from her  system which would be reasonable to be done on Sunday afternoon.  If that is  not conclusive for pulmonary embolus, then a reasonable consideration may be  to evaluate this woman with a myocardial perfusion imaging study to see if  the anterior and anteroapical regions of the left anterior descending  distribution shows significant ischemia, if so then percutaneous  revascularization may be warranted.      JH/MEDQ  D:  03/14/2005  T:  03/15/2005  Job:  [id number]

## 2011-03-28 NOTE — Discharge Summary (Signed)
[name], [name]                  ACCOUNT NO.:  [id number]   MEDICAL RECORD NO.:  [id number]          PATIENT TYPE:  INP   LOCATION:  1526                         FACILITY:  [facility]   PHYSICIAN:  Michaelyn Barter, M.D. DATE OF BIRTH:  1924/10/31   DATE OF ADMISSION:  08/08/2007  DATE OF DISCHARGE:  08/12/2007                               DISCHARGE SUMMARY   PRIMARY CARE PHYSICIAN:  Unassigned.   FINAL DIAGNOSES:  1. Gastroenteritis.  2. Weakness.  3. Renal insufficiency.   CONSULTATIONS:  1. [facility], Malcolm T. Russella Dar, MD, Clementeen Graham.  2. Infectious disease, Lacretia Leigh. Ninetta Lights, M.D.   PROCEDURES.:  1. Acute abdominal x-rays completed August 08, 2007.  2. Two-view chest x-ray completed August 09, 2007.   HISTORY OF PRESENT ILLNESS:  [name] is an 77 year old female who had  been recently discharged from [facility] 2 days prior to this  admission.  There she had been treated for MRSA enteritis. She had been  hospitalized there for 9 days. Following discharge, she developed  anorexia, nausea, vomiting, diarrhea and weakness.  She has a skin rash  on legs and chest. She indicated that the diarrhea appeared to be  subsiding; however, she was still nauseated and was suffering from  anorexia and fatigue.   PAST MEDICAL HISTORY:  Please see that dictated by Dr. Jonna Munro.   HOSPITAL COURSE:  #1.  GASTROENTERITIS:  It was reported that the patient had been treated  for MRSA-related gastroenteritis at [facility]. She was  subsequently started on Flagyl empirically.  [facility] was consulted.  Dr. Russella Dar saw the patient on August 10, 2007.  He indicated that the  patient was displaying resolving gastroenteritis.  He stated that the  staph species isolated in the patient's stool may have been a skin  contaminant and that this is not typically a GI pathogen.  Since her  symptoms were already resolving and were presumably infectious or an  enterotoxin,  he did not feel that colonoscopy or EGD were indicated.  He  did recommend that an infectious disease consultation take place.  Infectious diseases was consulted.  Dr. Johny Sax saw the patient.  He indicated that staph causes diarrhea via toxin-mediated mechanisms.  He also stated that the patient was most likely colonized with MRSA in  the rectal-anal area.  He did not believe her diarrhea was due to this,  however. He stated that, if the patient's diarrhea persisted, a  colonoscopy may be appropriate next. By the date of discharge, the  patient indicated that diarrhea had decreased.   #2.  WEAKNESS:  This may have been secondary to a combination of  dehydration and deconditioning. Physical therapy was consulted.  They  indicated that the patient may benefit from home health PT.  This was  arranged.   #3.  RENAL INSUFFICIENCY:  The patient arrived with a BUN of 19 and a  creatinine of 1.67.  There may have been a prerenal; i.e., dehydration  component to this secondary to the diarrhea that the patient had. Which  just IV fluid hydration,  thee patient's renal indices improved. By the  day of discharge, the patient's BUN was 15.  Her creatinine was 1.48. On  the date of discharge, the patient indicated that she felt significantly  better. During the course of her hospitalization, the patient had an  abdominal series x-rays completed which revealed no acute abdominal  abnormalities.  Her chest x-ray revealed cardiomegaly and pulmonary  vascular congestion, chronic interstitial lung disease.  A 2-D  echocardiogram was completed on September 30. It revealed overall left  ventricular systolic function was hyperdynamic.  Left ventricular  ejection fraction was estimated to range between 75-85%.  There was no  diagnostic evidence of left ventricular regional wall motion  abnormalities.   By the date of discharge, the patient indicated that she felt  significantly better.  Her diarrhea  has significantly decreased.  Her  vital signs showed temperature 97.8, heart rate 74, respirations 20,  blood pressure 133/76, O2 saturation 95% on room air.  Her white blood  cell count was 4.1, hemoglobin 12.7, hematocrit 37.1, platelets 183.  Sodium 133, potassium 4.1, chloride 99, CO2 26, BUN 15, creatinine 1.48,  glucose 87, calcium 9.2. The decision was made to discharge the patient  home.  She was discharged home on  1. Omeprazole  2. Fexofenadine.  3. Allopurinol.  4. Lidocaine patch.  5. Humalog insulin.  6. Aricept.  7. Clotrim cream.  8. Vitamin B12.  9. Aspirin.  10.Calcium carbonate.  11.Allegra 80 mg p.o. daily.  12.Lisinopril 40 mg p.o. daily.  13.Amlodipine 1 tablet daily.  14.Imdur 1 tablet p.o. daily.   She was told to call her primary care doctor, Dr. Suanne Marker, within 1-2  weeks for followup appointment.      Michaelyn Barter, M.D.  Electronically Signed     OR/MEDQ  D:  09/11/2007  T:  09/12/2007  Job:  [id number]

## 2011-03-28 NOTE — Discharge Summary (Signed)
NAMEELIANNAH, [name]                  ACCOUNT NO.:  [id number]   MEDICAL RECORD NO.:  [id number]          PATIENT TYPE:  INP   LOCATION:  4708                         FACILITY:  [facility]   PHYSICIAN:  Arvilla Meres, M.D. LHCDATE OF BIRTH:  1924/05/21   DATE OF ADMISSION:  03/14/2005  DATE OF DISCHARGE:  03/18/2005                           DISCHARGE SUMMARY - REFERRING   SUMMARY OF HISTORY:  [name] is a 77 year old white female who presented  to [facility] with chest discomfort and shortness of breath on Mar 12, 2005.  She was admitted by Dr. Raul Del and seen in consultation by Dr.  Andee Lineman. please refer to Dr. Margarita Mail consultation note on Mar 13, 2005. She  presented again with reoccurring chest discomfort and despite having a low-  risk Cardiolite study and two-D echocardiogram in September 2005 at a prior  evaluation, it was felt that she should undergo a more aggressive workup for  a definitive diagnosis and to rule out coronary artery disease. It was noted  at [facility] that her D-dimer was also elevated. They were unable to  do a VQ scan because the equipment was not working. They transferred to  [facility] for cardiac catheterization and possible CT  scan or VQ scan post catheterization.   Her history is also notable for hypertension, hyperlipidemia, diabetes,  obesity, remote tobacco, chronic shortness of breath, orthopnea, lower  extremity edema, dementia, myocardial infarction during gallbladder surgery  in the 80s, non-Q-wave myocardial infarction and September 2005 with a low-  risk Cardiolite, remote CVA, osteopenia with arthritis, chronic back pain,  chronic renal insufficiency and possible sleep apnea.   LABORATORY:  At [facility] CK, MBs and troponins were negative for  myocardial infarction. Admission H&H 12.6, and 36.3, normal indices,  platelet 160, WBCs 4.9.  Sodium 141, potassium 4.3, BUN 18, creatinine 1.6,  glucose  180. TSH 2.19.  Iron 93, TIBC 295% sats 32 at [facility].   At [place] CT of the chest on Mar 15, 2005,  did not  show any evidence of pulmonary embolus. At [facility],  at the time of transfer on Mar 14, 2005,  H&H of 1.2 and 32.9 platelets 135,  WBCs 3.6.  Subsequent hematologies continued to showed low H&H.  At the time  of discharge on Mar 18, 2005,  H&H was 10.0 an 28.6, normal indices,  platelets 122, WBCs 4.3.  At the time of transfer on Mar 14, 2005,  PT was  13.7.  Sodium was 137, potassium 4.0, BUN 21, creatinine 1.5, glucose 122.  Subsequent chemistries were notable for post catheterization on Mar 15, 2005, BUN and creatinine was stable at 22 and 1.5. However, on Mar 16, 2005,  rose to 27 and 2.0; on Mar 17, 2005, 32 was 2.3; however, on Mar 18, 2005,  prior to discharge BUN was 32, creatinine 1.9, sodium 134, potassium 4.2.  Repeat CK-MB x2 and a troponin at [facility] were  negative for myocardial infarction.  Urinalysis on Mar 17, 2005, was  unremarkable and stools were heme positive on Mar 16, 2005.   HOSPITAL COURSE:  [name] was transferred to [facility] on Mar 14, 2005 to undergo cardiac catheterization. This was  performed on Mar 14, 2005, by Dr. Dorethea Clan.  Please refer to dictated cardiac  catheterization note. Essentially, Dr. Dorethea Clan felt that she had single  vessel coronary artery disease involving her LAD with preserved LV function.  Dr. Dorethea Clan reviewed the films with Dr. Gerri Spore, who felt that the LAD was  not likely the culprit he recommended to obtain a chest CT to rule out  pulmonary embolism. She was continued on heparin.  Overnight her chest  discomfort had resolved.  BUN and creatinine was stable, thus she underwent  chest CT to evaluate her elevated D-dimer and pleuritic chest discomfort.  CT results did not show any evidence of pulmonary embolism.  Thus, heparin  was discontinued. However, on Mar 16, 2005, when discharge was anticipated,  it was noted that her BUN and creatinine had significantly increased from  prior to her catheterization and CT scan.  Thus, fluids were administered.  Her BUN and creatinine continued to increase on Mar 17, 2005, and Dr. Andee Lineman  continue the fluids. Dr. Andee Lineman noticed that she had heme-positive stools  with anemia.  He felt that she could be discharged when her creatinine was  stable with outpatient follow-up in regards to her anemia.  She may need a  bone marrow biopsy to rule out myelodysplastic anemia.  Dr. Andee Lineman ordered  serum urinary protein electrophoresis, a retic count and a urinalysis for  Bence-Jones proteins. By Mar 18, 2005, her BUN and creatinine had improved  and Dr. Andee Lineman felt that she could be discharged home. However, it was noted  that the labs that he had ordered on the previous day were still pending at  the time of discharge.   DISCHARGE DIAGNOSES:  1.  Chest discomfort of uncertain etiology.  2.  Single vessel coronary artery disease, continue medical treatment.  3.  Negative pulmonary embolism by CT scan.  4.  Acute renal insufficiency on top of chronic renal insufficiency post dye      load. (Baseline creatinine 1.6-1.7)  5.  Anemia with leukopenia.  Question bone marrow depression.  6.  Chronic lower extremity edema.  7.  Dementia.  8.  Osteoarthritis.  9.  Diabetes.  10. Hyperlipidemia, history as previously.   DISPOSITION:  She is discharged home. She is asked to continue a low salt.  ADA diet, low-fat, cholesterol.  Her activities were not restricted.  She  was asked to notify our office if she had any problems with her  catheterization site. She was asked not to take her Altace. She received a  new prescription for Imdur 15 mg daily.  She was asked to continue her Actos  45 mg daily. Glucotrol 5 mg b.i.d. HCTZ 25 daily. Prilosec 20 daily. Lidocaine patch q.12h.   Norvasc 5 daily.  Aspirin 325 daily.  She will have  a BMET next week on Tuesday Mar 25, 2005, at 9:45. She will follow up with  Suszanne Conners. Julious Oka., on Thursday April 10, 2005, at 10:45.  At the time of  follow-up, Mr. Elise Benne will need to obtain her labs from [facility] to follow up on the protein electrophoresis, Bence-Jones  protein, etc.  She is asked to keep her appointment for a sleep study and  to  follow up with Dr. Raul Del in guards to her anemia and leukopenia. Dr. Andee Lineman  feels that she may have bone marrow depression and may need a bone marrow  biopsy to rule out at myelodysplasia. Labs to rule out multiple myeloma and  amyloidosis are pending. Dr. Raul Del will continue to evaluate her heme-  positive stools despite normal iron studies.      EW/MEDQ  D:  03/18/2005  T:  03/18/2005  Job:  [id number]   cc:   Learta Codding, M.D. [facility]   Nena Jordan

## 2011-05-20 ENCOUNTER — Other Ambulatory Visit: Payer: Self-pay | Admitting: *Deleted

## 2011-05-20 MED ORDER — AMLODIPINE BESYLATE 2.5 MG PO TABS: 2.5000 mg | ORAL_TABLET | Freq: Every day | ORAL | Status: DC

## 2011-05-22 ENCOUNTER — Emergency Department (HOSPITAL_COMMUNITY): Payer: Medicare Other

## 2011-05-22 ENCOUNTER — Encounter: Payer: Self-pay | Admitting: Emergency Medicine

## 2011-05-22 ENCOUNTER — Emergency Department (HOSPITAL_COMMUNITY)
Admission: EM | Admit: 2011-05-22 | Discharge: 2011-05-23 | Disposition: A | Discharge status: Home / Self Care | Payer: Medicare Other | Attending: Emergency Medicine | Admitting: Emergency Medicine

## 2011-05-22 ENCOUNTER — Other Ambulatory Visit: Payer: Self-pay

## 2011-05-22 DIAGNOSIS — F0390 Unspecified dementia without behavioral disturbance: Secondary | ICD-10-CM | POA: Insufficient documentation

## 2011-05-22 DIAGNOSIS — I4891 Unspecified atrial fibrillation: Secondary | ICD-10-CM | POA: Insufficient documentation

## 2011-05-22 DIAGNOSIS — R5381 Other malaise: Secondary | ICD-10-CM | POA: Insufficient documentation

## 2011-05-22 DIAGNOSIS — R0609 Other forms of dyspnea: Secondary | ICD-10-CM | POA: Insufficient documentation

## 2011-05-22 DIAGNOSIS — J4489 Other specified chronic obstructive pulmonary disease: Secondary | ICD-10-CM | POA: Insufficient documentation

## 2011-05-22 DIAGNOSIS — R531 Weakness: Secondary | ICD-10-CM

## 2011-05-22 DIAGNOSIS — E119 Type 2 diabetes mellitus without complications: Secondary | ICD-10-CM | POA: Insufficient documentation

## 2011-05-22 DIAGNOSIS — J449 Chronic obstructive pulmonary disease, unspecified: Secondary | ICD-10-CM | POA: Insufficient documentation

## 2011-05-22 DIAGNOSIS — R0989 Other specified symptoms and signs involving the circulatory and respiratory systems: Secondary | ICD-10-CM | POA: Insufficient documentation

## 2011-05-22 DIAGNOSIS — R059 Cough, unspecified: Secondary | ICD-10-CM | POA: Insufficient documentation

## 2011-05-22 DIAGNOSIS — R05 Cough: Secondary | ICD-10-CM | POA: Insufficient documentation

## 2011-05-22 DIAGNOSIS — I509 Heart failure, unspecified: Secondary | ICD-10-CM | POA: Insufficient documentation

## 2011-05-22 HISTORY — DX: Chronic obstructive pulmonary disease, unspecified: J44.9

## 2011-05-22 HISTORY — DX: Unspecified atrial fibrillation: I48.91

## 2011-05-22 HISTORY — DX: Heart failure, unspecified: I50.9

## 2011-05-22 LAB — DIFFERENTIAL
Basophils Absolute: 0 10*3/uL (ref 0.0–0.1)
Basophils Relative: 1 % (ref 0–1)
Neutro Abs: 2.7 10*3/uL (ref 1.7–7.7)
Neutrophils Relative %: 48 % (ref 43–77)

## 2011-05-22 LAB — CBC
HCT: 38.9 % (ref 36.0–46.0)
MCH: 33.2 pg (ref 26.0–34.0)
MCHC: 33.4 g/dL (ref 30.0–36.0)
MCV: 99.2 fL (ref 78.0–100.0)
Platelets: 152 10*3/uL (ref 150–400)
RDW: 13.5 % (ref 11.5–15.5)

## 2011-05-22 LAB — COMPREHENSIVE METABOLIC PANEL
AST: 22 U/L (ref 0–37)
Albumin: 2.5 g/dL — ABNORMAL LOW (ref 3.5–5.2)
Alkaline Phosphatase: 68 U/L (ref 39–117)
Chloride: 98 mEq/L (ref 96–112)
Creatinine, Ser: 1.07 mg/dL (ref 0.50–1.10)
Potassium: 3.3 mEq/L — ABNORMAL LOW (ref 3.5–5.1)
Total Bilirubin: 0.3 mg/dL (ref 0.3–1.2)
Total Protein: 7.1 g/dL (ref 6.0–8.3)

## 2011-05-22 LAB — CK TOTAL AND CKMB (NOT AT ARMC)
Relative Index: INVALID (ref 0.0–2.5)
Total CK: 55 U/L (ref 7–177)

## 2011-05-22 LAB — TROPONIN I: Troponin I: <0.3 ng/mL (ref <0.30)

## 2011-05-22 MED ORDER — SODIUM CHLORIDE 0.9 % IJ SOLN
INTRAMUSCULAR | Status: AC
  Administered 2011-05-22: 20:00:00
  Filled 2011-05-22: qty 10

## 2011-05-22 NOTE — ED Notes (Signed)
Increased sob over the last 3 days

## 2011-05-22 NOTE — ED Provider Notes (Addendum)
History     Chief Complaint  Patient presents with  . Respiratory Distress   HPI Comments: Level 5 caveat for dementia. Family reports pt with increased sob x 3 days with associated cough, BLE swelling, and weakness. Pt usually ambulatory with her walker but has had difficulty walking across the room the past few days d/t weakness and sob. Home nurse also told family that pt c/o brief episodes of cp yesterday. No recorded fever or vomiting. +diarrhea.  Patient is a 77 y.o. female presenting with shortness of breath. The history is provided by a relative. The history is limited by the condition of the patient.  Shortness of Breath  The current episode started 3 to 5 days ago. The onset was gradual. The problem occurs continuously. The problem has been gradually worsening. The problem is moderate. The symptoms are relieved by nothing. The symptoms are aggravated by activity. Associated symptoms include chest pain, cough and shortness of breath. Pertinent negatives include no fever and no wheezing. Past medical history comments: COPD and CHF.    Past Medical History  Diagnosis Date  . CHF (congestive heart failure)   . COPD (chronic obstructive pulmonary disease)   . Diabetes mellitus   . A-fib     History reviewed. No pertinent past surgical history.  History reviewed. No pertinent family history.  History  Substance Use Topics  . Smoking status: Never Smoker   . Smokeless tobacco: Not on file  . Alcohol Use: No    OB History    Grav Para Term Preterm Abortions TAB SAB Ect Mult Living                  Review of Systems  Unable to perform ROS: Dementia  Constitutional: Negative for fever.  Respiratory: Positive for cough and shortness of breath. Negative for wheezing.   Cardiovascular: Positive for chest pain.    Physical Exam  BP 154/69  Pulse 68  Temp(Src) 98.3 F (36.8 C) (Oral)  Resp 18  Wt 200 lb (90.719 kg)  SpO2 98%  Physical Exam  Nursing note and vitals  reviewed. Constitutional: She appears well-developed and well-nourished. No distress.  HENT:  Head: Normocephalic.  Mouth/Throat: Mucous membranes are normal.  Eyes:       Normal appearance  Neck: Normal range of motion. Neck supple.  Cardiovascular: Normal rate and regular rhythm.  Exam reveals no gallop and no friction rub.   No murmur heard. Pulmonary/Chest: Effort normal and breath sounds normal. She has no rhonchi. She has no rales.       Increased end expiratory phase  Abdominal: Soft. There is no tenderness.  Musculoskeletal: Normal range of motion. She exhibits edema.       2+BLE edema  Neurological: She is alert.       Not speaking but follows commands well; Motor intact in all extremities  Skin: Skin is warm and dry. Rash noted.       Diffuse papular rash  Psychiatric: She has a normal mood and affect.    ED Course  Procedures  Written by Enos Fling acting as scribe for Dr. Freida Busman.  MDM  Date: 05/22/2011  Rate: 69  Rhythm: normal sinus rhythm  QRS Axis: normal  Intervals: QT prolonged  ST/T Wave abnormalities: nonspecific ST/T changes  Conduction Disutrbances:none and first-degree A-V block   Narrative Interpretation:   Old EKG Reviewed: none available and changes noted  Labs xrays reviewed, no evidence of acs or chf, spoke with family, no real  dyspnea at home, pt alert, talkative, requesting to go home   I personally performed the services described in this documentation, which was scribed in my presence. The recorded information has been reviewed and considered. No att. providers found    I personally performed the services described in this documentation, which was scribed in my presence. The recorded information has been reviewed and considered. No att. providers found  Toy Baker, MD 05/22/11 1610  Toy Baker, MD 06/21/11 1649  Toy Baker, MD 06/21/11 1650

## 2011-05-22 NOTE — ED Notes (Signed)
Pt brought to er by ems per family request for ?sob x2 days,  Pt has h/o alz. And is taken care of by family members.  Pt denies any pain or sob at arrival, appears to be resting comfortably on stretcher.  Alert to place, thinks today is sat.   sats on 2l  are 98%, all other vs are stable.

## 2011-07-31 LAB — CBC
HCT: 34.9 — ABNORMAL LOW
Hemoglobin: 10.3 — ABNORMAL LOW
MCHC: 34.1
MCHC: 34.3
MCHC: 34.4
MCV: 102.8 — ABNORMAL HIGH
Platelets: 125 — ABNORMAL LOW
Platelets: 133 — ABNORMAL LOW
Platelets: 162
RBC: 2.95 — ABNORMAL LOW
RBC: 3.13 — ABNORMAL LOW
RDW: 14
RDW: 14.2
WBC: 3.7 — ABNORMAL LOW
WBC: 4

## 2011-07-31 LAB — BASIC METABOLIC PANEL
BUN: 22
CO2: 27
Calcium: 9.2
Calcium: 9.2
Chloride: 104
Chloride: 107
Creatinine, Ser: 1.27 — ABNORMAL HIGH
Creatinine, Ser: 1.34 — ABNORMAL HIGH
GFR calc Af Amer: 46 — ABNORMAL LOW
GFR calc Af Amer: 49 — ABNORMAL LOW
Sodium: 137

## 2011-07-31 LAB — PROTIME-INR
INR: 1.1
Prothrombin Time: 14.6

## 2011-07-31 LAB — COMPREHENSIVE METABOLIC PANEL
Albumin: 3.5
BUN: 27 — ABNORMAL HIGH
Calcium: 9.5
Creatinine, Ser: 1.53 — ABNORMAL HIGH
Glucose, Bld: 124 — ABNORMAL HIGH
Potassium: 4.5
Total Protein: 7.2

## 2011-07-31 LAB — TSH: TSH: 3.294

## 2011-07-31 LAB — MAGNESIUM: Magnesium: 1.9

## 2011-07-31 LAB — APTT: aPTT: 34

## 2011-07-31 LAB — HEMOGLOBIN A1C: Mean Plasma Glucose: 168

## 2011-07-31 LAB — B-NATRIURETIC PEPTIDE (CONVERTED LAB): Pro B Natriuretic peptide (BNP): <30

## 2011-07-31 LAB — CARDIAC PANEL(CRET KIN+CKTOT+MB+TROPI)
CK, MB: 1
Troponin I: 0.01

## 2011-08-21 LAB — CBC
HCT: 37.1
HCT: 30.4 — ABNORMAL LOW
HCT: 32.9 — ABNORMAL LOW
Hemoglobin: 12.7
Hemoglobin: 11.3 — ABNORMAL LOW
MCHC: 34.1
MCHC: 34.4
MCHC: 34.8
MCV: 101.5 — ABNORMAL HIGH
Platelets: 142 — ABNORMAL LOW
RBC: 3.66 — ABNORMAL LOW
RBC: 3.25 — ABNORMAL LOW
RDW: 13.1
RDW: 14.2 — ABNORMAL HIGH

## 2011-08-21 LAB — URINALYSIS, ROUTINE W REFLEX MICROSCOPIC
Glucose, UA: NEGATIVE
Hgb urine dipstick: NEGATIVE
Protein, ur: NEGATIVE
Specific Gravity, Urine: 1.011
pH: 6

## 2011-08-21 LAB — CLOSTRIDIUM DIFFICILE EIA: C difficile Toxins A+B, EIA: NEGATIVE

## 2011-08-21 LAB — BASIC METABOLIC PANEL
BUN: 15
BUN: 19
CO2: 26
CO2: 24
CO2: 27
CO2: 28
Calcium: 8.9
Calcium: 9
Chloride: 99
Chloride: 103
Creatinine, Ser: 1.38 — ABNORMAL HIGH
Creatinine, Ser: 1.67 — ABNORMAL HIGH
GFR calc Af Amer: 41 — ABNORMAL LOW
GFR calc Af Amer: 44 — ABNORMAL LOW
GFR calc non Af Amer: 35 — ABNORMAL LOW
Glucose, Bld: 87
Glucose, Bld: 113 — ABNORMAL HIGH
Glucose, Bld: 119 — ABNORMAL HIGH
Potassium: 4.7
Potassium: 4.8
Sodium: 133 — ABNORMAL LOW

## 2011-08-21 LAB — LIPID PANEL
Triglycerides: 147
VLDL: 29

## 2011-08-21 LAB — FECAL LACTOFERRIN, QUANT: Fecal Lactoferrin: NEGATIVE

## 2011-08-21 LAB — HEPATIC FUNCTION PANEL
ALT: 16
AST: 27
Bilirubin, Direct: 0.1

## 2011-08-21 LAB — DIFFERENTIAL
Basophils Relative: 1
Eosinophils Relative: 9 — ABNORMAL HIGH
Monocytes Absolute: 0.5
Monocytes Relative: 12 — ABNORMAL HIGH
Neutro Abs: 2.3

## 2011-08-21 LAB — HEMOGLOBIN A1C
Hgb A1c MFr Bld: 7.4 — ABNORMAL HIGH
Mean Plasma Glucose: 186

## 2011-08-21 LAB — OVA AND PARASITE EXAMINATION: Ova and parasites: NONE SEEN

## 2011-08-21 LAB — POCT CARDIAC MARKERS: CKMB, poc: <1 — ABNORMAL LOW

## 2011-08-21 LAB — PROTIME-INR: Prothrombin Time: 14.9

## 2011-08-21 LAB — APTT: aPTT: 38 — ABNORMAL HIGH

## 2011-08-21 LAB — STOOL CULTURE

## 2011-08-21 LAB — TROPONIN I: Troponin I: 0.02

## 2011-09-15 ENCOUNTER — Other Ambulatory Visit: Payer: Self-pay | Admitting: Cardiology

## 2011-10-10 ENCOUNTER — Other Ambulatory Visit: Payer: Self-pay

## 2011-10-10 ENCOUNTER — Emergency Department (HOSPITAL_COMMUNITY): Payer: Medicare Other

## 2011-10-10 ENCOUNTER — Encounter (HOSPITAL_COMMUNITY): Payer: Self-pay | Admitting: Emergency Medicine

## 2011-10-10 ENCOUNTER — Emergency Department (HOSPITAL_COMMUNITY)
Admission: EM | Admit: 2011-10-10 | Discharge: 2011-10-10 | Disposition: A | Discharge status: Home / Self Care | Payer: Medicare Other | Attending: Emergency Medicine | Admitting: Emergency Medicine

## 2011-10-10 DIAGNOSIS — J3489 Other specified disorders of nose and nasal sinuses: Secondary | ICD-10-CM | POA: Insufficient documentation

## 2011-10-10 DIAGNOSIS — R5383 Other fatigue: Secondary | ICD-10-CM | POA: Insufficient documentation

## 2011-10-10 DIAGNOSIS — N390 Urinary tract infection, site not specified: Secondary | ICD-10-CM | POA: Insufficient documentation

## 2011-10-10 DIAGNOSIS — I4891 Unspecified atrial fibrillation: Secondary | ICD-10-CM | POA: Insufficient documentation

## 2011-10-10 DIAGNOSIS — J4489 Other specified chronic obstructive pulmonary disease: Secondary | ICD-10-CM | POA: Insufficient documentation

## 2011-10-10 DIAGNOSIS — E119 Type 2 diabetes mellitus without complications: Secondary | ICD-10-CM | POA: Insufficient documentation

## 2011-10-10 DIAGNOSIS — I509 Heart failure, unspecified: Secondary | ICD-10-CM | POA: Insufficient documentation

## 2011-10-10 DIAGNOSIS — R1110 Vomiting, unspecified: Secondary | ICD-10-CM

## 2011-10-10 DIAGNOSIS — R109 Unspecified abdominal pain: Secondary | ICD-10-CM | POA: Insufficient documentation

## 2011-10-10 DIAGNOSIS — J449 Chronic obstructive pulmonary disease, unspecified: Secondary | ICD-10-CM | POA: Insufficient documentation

## 2011-10-10 DIAGNOSIS — R112 Nausea with vomiting, unspecified: Secondary | ICD-10-CM | POA: Insufficient documentation

## 2011-10-10 DIAGNOSIS — R5381 Other malaise: Secondary | ICD-10-CM | POA: Insufficient documentation

## 2011-10-10 LAB — DIFFERENTIAL
Band Neutrophils: 3 % (ref 0–10)
Basophils Absolute: 0 10*3/uL (ref 0.0–0.1)
Basophils Relative: 0 % (ref 0–1)
Blasts: 0 %
Eosinophils Absolute: 1.3 10*3/uL — ABNORMAL HIGH (ref 0.0–0.7)
Eosinophils Relative: 25 % — ABNORMAL HIGH (ref 0–5)
Lymphocytes Relative: 29 % (ref 12–46)
Metamyelocytes Relative: 0 %
Monocytes Absolute: 0.5 10*3/uL (ref 0.1–1.0)
Monocytes Relative: 9 % (ref 3–12)
nRBC: 0 /100 WBC

## 2011-10-10 LAB — CBC
HCT: 36.2 % (ref 36.0–46.0)
Hemoglobin: 11.9 g/dL — ABNORMAL LOW (ref 12.0–15.0)
MCV: 102.8 fL — ABNORMAL HIGH (ref 78.0–100.0)
RBC: 3.52 MIL/uL — ABNORMAL LOW (ref 3.87–5.11)
RDW: 13.7 % (ref 11.5–15.5)
WBC: 5.1 10*3/uL (ref 4.0–10.5)

## 2011-10-10 LAB — LIPASE, BLOOD: Lipase: 16 U/L (ref 11–59)

## 2011-10-10 LAB — HEPATIC FUNCTION PANEL
AST: 21 U/L (ref 0–37)
Albumin: 2.6 g/dL — ABNORMAL LOW (ref 3.5–5.2)
Total Bilirubin: 0.3 mg/dL (ref 0.3–1.2)

## 2011-10-10 LAB — URINALYSIS, ROUTINE W REFLEX MICROSCOPIC
Glucose, UA: NEGATIVE mg/dL
Leukocytes, UA: NEGATIVE
Protein, ur: >300 mg/dL — AB
Specific Gravity, Urine: >1.03 — ABNORMAL HIGH (ref 1.005–1.030)
pH: 6 (ref 5.0–8.0)

## 2011-10-10 LAB — BASIC METABOLIC PANEL
CO2: 30 mEq/L (ref 19–32)
Calcium: 9.5 mg/dL (ref 8.4–10.5)
Creatinine, Ser: 1.28 mg/dL — ABNORMAL HIGH (ref 0.50–1.10)
GFR calc non Af Amer: 37 mL/min — ABNORMAL LOW (ref >90)
Glucose, Bld: 134 mg/dL — ABNORMAL HIGH (ref 70–99)
Sodium: 139 mEq/L (ref 135–145)

## 2011-10-10 LAB — URINE MICROSCOPIC-ADD ON

## 2011-10-10 LAB — POCT I-STAT TROPONIN I: Troponin i, poc: 0 ng/mL (ref 0.00–0.08)

## 2011-10-10 MED ORDER — ONDANSETRON 4 MG PO TBDP: 4.0000 mg | ORAL_TABLET | Freq: Three times a day (TID) | ORAL | Status: AC | PRN

## 2011-10-10 MED ORDER — CEFTRIAXONE SODIUM 1 G IJ SOLR
1.0000 g | Freq: Once | INTRAMUSCULAR | Status: DC
  Filled 2011-10-10: qty 10

## 2011-10-10 MED ORDER — DEXTROSE 5 % IV SOLN
INTRAVENOUS | Status: AC
  Filled 2011-10-10: qty 10

## 2011-10-10 MED ORDER — CEPHALEXIN 500 MG PO CAPS: 500.0000 mg | ORAL_CAPSULE | Freq: Four times a day (QID) | ORAL | Status: AC

## 2011-10-10 MED ORDER — ONDANSETRON HCL 4 MG/2ML IJ SOLN
4.0000 mg | Freq: Once | INTRAMUSCULAR | Status: AC
  Administered 2011-10-10: 4 mg via INTRAVENOUS
  Filled 2011-10-10: qty 2

## 2011-10-10 MED ORDER — CEFTRIAXONE SODIUM 500 MG IJ SOLR
500.0000 mg | Freq: Once | INTRAMUSCULAR | Status: AC
  Administered 2011-10-10: 500 mg via INTRAMUSCULAR
  Filled 2011-10-10: qty 500

## 2011-10-10 MED ORDER — SODIUM CHLORIDE 0.9 % IV SOLN: Freq: Once | INTRAVENOUS | Status: DC

## 2011-10-10 NOTE — ED Notes (Signed)
Family here states pt has been congested for a few days. States she has had some nausea for sinus drainage. Also, states the think pt may have a UTI

## 2011-10-10 NOTE — ED Notes (Signed)
Pt assisted off and on the bedpan to void

## 2011-10-10 NOTE — ED Notes (Signed)
Pt eating. Waiting to be reeval and disposition

## 2011-10-10 NOTE — ED Provider Notes (Signed)
History     CSN: [id number] Arrival date & time: 10/10/2011 12:57 PM   First MD Initiated Contact with Patient 10/10/11 1404      Chief Complaint  Patient presents with  . Nasal Congestion  . Weakness    (Consider location/radiation/quality/duration/timing/severity/associated sxs/prior treatment) Patient is a 77 y.o. female presenting with weakness. The history is provided by the patient (The patient complains of nausea no abdominal pain mild with).  Weakness The primary symptoms include nausea and vomiting. Primary symptoms do not include headaches, syncope, loss of consciousness, seizures, visual change or paresthesias. The symptoms began 12 to 24 hours ago. The symptoms are improving.  Additional symptoms include weakness. Additional symptoms do not include photophobia, aura or hallucinations. Medical issues do not include seizures. Workup history does not include carotid ultrasound.    Past Medical History  Diagnosis Date  . CHF (congestive heart failure)   . COPD (chronic obstructive pulmonary disease)   . Diabetes mellitus   . A-fib     History reviewed. No pertinent past surgical history.  No family history on file.  History  Substance Use Topics  . Smoking status: Never Smoker   . Smokeless tobacco: Not on file  . Alcohol Use: No    OB History    Grav Para Term Preterm Abortions TAB SAB Ect Mult Living                  Review of Systems  Constitutional: Negative for fatigue.  HENT: Negative for congestion, sinus pressure and ear discharge.   Eyes: Negative for photophobia and discharge.  Respiratory: Negative for cough.   Cardiovascular: Negative for chest pain and syncope.  Gastrointestinal: Positive for nausea and vomiting. Negative for abdominal pain and diarrhea.  Genitourinary: Negative for frequency and hematuria.  Musculoskeletal: Negative for back pain.  Skin: Negative for rash.  Neurological: Positive for weakness. Negative for seizures, loss  of consciousness, headaches and paresthesias.  Hematological: Negative.   Psychiatric/Behavioral: Negative for hallucinations.    Allergies  Haloperidol lactate and Lorazepam  Home Medications   Current Outpatient Rx  Name Route Sig Dispense Refill  . ALLOPURINOL 100 MG PO TABS Oral Take 100 mg by mouth daily.      Marland Kitchen ALPRAZOLAM 0.25 MG PO TABS Oral Take 0.25 mg by mouth 3 (three) times daily as needed. For anxiety     . ASPIRIN 81 MG PO TABS Oral Take 81 mg by mouth daily.      Marland Kitchen CALCIUM CARBONATE 600 MG PO TABS Oral Take 600 mg by mouth 2 (two) times daily with a meal.      . HYDROCODONE-ACETAMINOPHEN 7.5-500 MG PO TABS Oral Take 1 tablet by mouth every 6 (six) hours as needed. pain     . ISOSORBIDE MONONITRATE 20 MG PO TABS Oral Take 20 mg by mouth 2 (two) times daily.      . NORVASC 2.5 MG PO TABS  TAKE (1) TABLET BY MOUTH ONCE DAILY. 30 each 0    PATIENT NEED OFFICE VISIT/CALL OFFICE TO SCHEDULE  ...  . OMEPRAZOLE 20 MG PO CPDR Oral Take 20 mg by mouth daily.      Marland Kitchen SIMVASTATIN 40 MG PO TABS Oral Take 40 mg by mouth at bedtime.      Marland Kitchen VITAMIN B-12 1000 MCG PO TABS Oral Take 1,000 mcg by mouth daily.      . CEPHALEXIN 500 MG PO CAPS Oral Take 1 capsule (500 mg total) by mouth 4 (four) times  daily. 28 capsule 0  . ONDANSETRON 4 MG PO TBDP Oral Take 1 tablet (4 mg total) by mouth every 8 (eight) hours as needed for nausea. 20 tablet 0  . PSEUDOEPHEDRINE-ACETAMINOPHEN 30-500 MG PO TABS Oral Take 1 tablet by mouth every 4 (four) hours as needed.        BP 174/71  Pulse 62  Temp 98.4 F (36.9 C)  Resp 19  Wt 200 lb (90.719 kg)  SpO2 95%  Physical Exam  Constitutional: She is oriented to person, place, and time. She appears well-developed.  HENT:  Head: Normocephalic and atraumatic.  Eyes: Conjunctivae and EOM are normal. No scleral icterus.  Neck: Neck supple. No thyromegaly present.  Cardiovascular: Normal rate and regular rhythm.  Exam reveals no gallop and no friction rub.    No murmur heard. Pulmonary/Chest: No stridor. She has no wheezes. She has no rales. She exhibits no tenderness.  Abdominal: She exhibits no distension. There is no tenderness. There is no rebound.  Musculoskeletal: Normal range of motion. She exhibits no edema.  Lymphadenopathy:    She has no cervical adenopathy.  Neurological: She is oriented to person, place, and time. Coordination normal.  Skin: No rash noted. No erythema.  Psychiatric: She has a normal mood and affect. Her behavior is normal.    ED Course  Procedures (including critical care time)  Labs Reviewed  URINALYSIS, ROUTINE W REFLEX MICROSCOPIC - Abnormal; Notable for the following:    APPearance TURBID (*)    Specific Gravity, Urine >1.030 (*)    Hgb urine dipstick TRACE (*)    Protein, ur >300 (*)    All other components within normal limits  CBC - Abnormal; Notable for the following:    RBC 3.52 (*)    Hemoglobin 11.9 (*)    MCV 102.8 (*)    Platelets 129 (*)    All other components within normal limits  DIFFERENTIAL - Abnormal; Notable for the following:    Neutrophils Relative 34 (*) CORRECTED ON 11/30 AT 1424: PREVIOUSLY REPORTED AS 0   Eosinophils Relative 25 (*) CORRECTED ON 11/30 AT 1424: PREVIOUSLY REPORTED AS 0   Eosinophils Absolute 1.3 (*) CORRECTED ON 11/30 AT 1424: PREVIOUSLY REPORTED AS 0.0   All other components within normal limits  BASIC METABOLIC PANEL - Abnormal; Notable for the following:    Glucose, Bld 134 (*)    Creatinine, Ser 1.28 (*)    GFR calc non Af Amer 37 (*)    GFR calc Af Amer 43 (*)    All other components within normal limits  HEPATIC FUNCTION PANEL - Abnormal; Notable for the following:    Albumin 2.6 (*)    Indirect Bilirubin 0.2 (*)    All other components within normal limits  URINE MICROSCOPIC-ADD ON - Abnormal; Notable for the following:    Bacteria, UA MANY (*)    All other components within normal limits  PRO B NATRIURETIC PEPTIDE  LIPASE, BLOOD  POCT I-STAT  TROPONIN I  GLUCOSE, CAPILLARY  I-STAT TROPONIN I  URINE CULTURE   Dg Chest Portable 1 View  10/10/2011  *RADIOLOGY REPORT*  Clinical Data: Congestion.  History of congestive heart failure.  PORTABLE CHEST - 1 VIEW  Comparison: Two-view chest 05/22/2011.  Findings: The heart is enlarged.  Atherosclerotic calcifications are again noted within the aorta.  The lung volumes are low.  Mild dependent atelectasis is present bilaterally.  No other airspace consolidation is evident.  There is no edema to suggest failure.  IMPRESSION:  1.  Stable cardiomegaly without failure. 2.  Low lung volumes. 3.  No acute cardiopulmonary disease.  Original Report Authenticated By: Jamesetta Orleans. MATTERN, M.D.   Dg Abd 2 Views  10/10/2011  *RADIOLOGY REPORT*  Clinical Data: Abdominal pain  ABDOMEN - 2 VIEW  Comparison: 09/29/2010  Findings: Paucity of bowel gas. No gross bowel dilatation. Scattered atherosclerotic calcifications and phleboliths. Bones demineralized with degenerative disc disease changes lower lumbar spine. Question hepatomegaly.  IMPRESSION: Question hepatomegaly. Nonobstructive bowel gas pattern.  Original Report Authenticated By: Lollie Marrow, M.D.     1. UTI (lower urinary tract infection)   2. Vomiting     Pt improved with tx  MDM  Margit Hanks, MD 10/10/11 2033

## 2011-10-10 NOTE — ED Notes (Signed)
Pt c/o congestion/weakness/not feeling well.

## 2011-10-10 NOTE — ED Notes (Signed)
Pt waiting to be reeval and disposition 

## 2011-10-10 NOTE — ED Notes (Signed)
poc troponin 0.00. edp aware

## 2011-10-12 LAB — URINE CULTURE

## 2011-10-13 ENCOUNTER — Other Ambulatory Visit: Payer: Self-pay | Admitting: Cardiology

## 2011-12-15 ENCOUNTER — Other Ambulatory Visit: Payer: Self-pay

## 2011-12-15 ENCOUNTER — Encounter (HOSPITAL_COMMUNITY): Payer: Self-pay | Admitting: *Deleted

## 2011-12-15 ENCOUNTER — Emergency Department (HOSPITAL_COMMUNITY)
Admission: EM | Admit: 2011-12-15 | Discharge: 2011-12-15 | Disposition: A | Discharge status: Home / Self Care | Payer: Medicare Other | Attending: Emergency Medicine | Admitting: Emergency Medicine

## 2011-12-15 ENCOUNTER — Emergency Department (HOSPITAL_COMMUNITY): Payer: Medicare Other

## 2011-12-15 DIAGNOSIS — E119 Type 2 diabetes mellitus without complications: Secondary | ICD-10-CM | POA: Insufficient documentation

## 2011-12-15 DIAGNOSIS — J449 Chronic obstructive pulmonary disease, unspecified: Secondary | ICD-10-CM | POA: Insufficient documentation

## 2011-12-15 DIAGNOSIS — I491 Atrial premature depolarization: Secondary | ICD-10-CM | POA: Insufficient documentation

## 2011-12-15 DIAGNOSIS — R0989 Other specified symptoms and signs involving the circulatory and respiratory systems: Secondary | ICD-10-CM | POA: Insufficient documentation

## 2011-12-15 DIAGNOSIS — R6883 Chills (without fever): Secondary | ICD-10-CM | POA: Insufficient documentation

## 2011-12-15 DIAGNOSIS — R5381 Other malaise: Secondary | ICD-10-CM | POA: Insufficient documentation

## 2011-12-15 DIAGNOSIS — J4489 Other specified chronic obstructive pulmonary disease: Secondary | ICD-10-CM | POA: Insufficient documentation

## 2011-12-15 DIAGNOSIS — N390 Urinary tract infection, site not specified: Secondary | ICD-10-CM

## 2011-12-15 DIAGNOSIS — R0600 Dyspnea, unspecified: Secondary | ICD-10-CM

## 2011-12-15 DIAGNOSIS — I4510 Unspecified right bundle-branch block: Secondary | ICD-10-CM | POA: Insufficient documentation

## 2011-12-15 DIAGNOSIS — R059 Cough, unspecified: Secondary | ICD-10-CM | POA: Insufficient documentation

## 2011-12-15 DIAGNOSIS — R0602 Shortness of breath: Secondary | ICD-10-CM | POA: Insufficient documentation

## 2011-12-15 DIAGNOSIS — I4891 Unspecified atrial fibrillation: Secondary | ICD-10-CM | POA: Insufficient documentation

## 2011-12-15 DIAGNOSIS — Z7982 Long term (current) use of aspirin: Secondary | ICD-10-CM | POA: Insufficient documentation

## 2011-12-15 DIAGNOSIS — I440 Atrioventricular block, first degree: Secondary | ICD-10-CM | POA: Insufficient documentation

## 2011-12-15 DIAGNOSIS — R05 Cough: Secondary | ICD-10-CM | POA: Insufficient documentation

## 2011-12-15 DIAGNOSIS — I509 Heart failure, unspecified: Secondary | ICD-10-CM | POA: Insufficient documentation

## 2011-12-15 DIAGNOSIS — R0609 Other forms of dyspnea: Secondary | ICD-10-CM | POA: Insufficient documentation

## 2011-12-15 DIAGNOSIS — F0390 Unspecified dementia without behavioral disturbance: Secondary | ICD-10-CM | POA: Insufficient documentation

## 2011-12-15 LAB — GLUCOSE, CAPILLARY: Glucose-Capillary: 149 mg/dL — ABNORMAL HIGH (ref 70–99)

## 2011-12-15 LAB — URINALYSIS, ROUTINE W REFLEX MICROSCOPIC
Bilirubin Urine: NEGATIVE
Nitrite: NEGATIVE
Protein, ur: 100 mg/dL — AB
Urobilinogen, UA: 0.2 mg/dL (ref 0.0–1.0)

## 2011-12-15 LAB — DIFFERENTIAL
Lymphocytes Relative: 33 % (ref 12–46)
Lymphs Abs: 1.6 10*3/uL (ref 0.7–4.0)
Monocytes Relative: 10 % (ref 3–12)
Neutro Abs: 1.9 10*3/uL (ref 1.7–7.7)
Neutrophils Relative %: 39 % — ABNORMAL LOW (ref 43–77)

## 2011-12-15 LAB — URINE MICROSCOPIC-ADD ON

## 2011-12-15 LAB — TROPONIN I: Troponin I: <0.3 ng/mL (ref <0.30)

## 2011-12-15 LAB — BASIC METABOLIC PANEL
BUN: 18 mg/dL (ref 6–23)
Chloride: 101 mEq/L (ref 96–112)
Glucose, Bld: 177 mg/dL — ABNORMAL HIGH (ref 70–99)
Potassium: 3.5 mEq/L (ref 3.5–5.1)

## 2011-12-15 LAB — CBC
Hemoglobin: 13 g/dL (ref 12.0–15.0)
RBC: 3.9 MIL/uL (ref 3.87–5.11)
WBC: 4.8 10*3/uL (ref 4.0–10.5)

## 2011-12-15 MED ORDER — CEPHALEXIN 500 MG PO CAPS
500.0000 mg | ORAL_CAPSULE | Freq: Once | ORAL | Status: AC
  Administered 2011-12-15: 500 mg via ORAL
  Filled 2011-12-15: qty 1

## 2011-12-15 MED ORDER — CEPHALEXIN 500 MG PO CAPS: 500.0000 mg | ORAL_CAPSULE | Freq: Three times a day (TID) | ORAL | Status: AC

## 2011-12-15 NOTE — ED Provider Notes (Signed)
History     CSN: [id number]  Arrival date & time 12/15/11  1636   First MD Initiated Contact with Patient 12/15/11 1640      Chief Complaint  Patient presents with  . Fatigue    (Consider location/radiation/quality/duration/timing/severity/associated sxs/prior treatment) The history is provided by a relative. The history is limited by the condition of the patient (dementia).  Per her family, she has been coughing for the last 3-4 days and has been short of breath. She's had some generalized weakness with this. She has had some episodes of chest pain. She's also been vomiting after eating. She has had chills and has been putting multiple blankets on the keep warm. She's not had any known fever nor has she been breaking out in sweats. She does have a history of coronary artery disease which is not amenable to any interventions. Them has not given her any treatment. They say that in the past when she has had similar symptoms she has had fluid buildup in her lungs.  Past Medical History  Diagnosis Date  . CHF (congestive heart failure)   . COPD (chronic obstructive pulmonary disease)   . Diabetes mellitus   . A-fib     History reviewed. No pertinent past surgical history.  No family history on file.  History  Substance Use Topics  . Smoking status: Never Smoker   . Smokeless tobacco: Not on file  . Alcohol Use: No    OB History    Grav Para Term Preterm Abortions TAB SAB Ect Mult Living                  Review of Systems  Unable to perform ROS: Dementia    Allergies  Haloperidol lactate and Lorazepam  Home Medications   Current Outpatient Rx  Name Route Sig Dispense Refill  . ALLOPURINOL 100 MG PO TABS Oral Take 100 mg by mouth daily.      Marland Kitchen ALPRAZOLAM 0.25 MG PO TABS Oral Take 0.25 mg by mouth 3 (three) times daily as needed. For anxiety     . ASPIRIN 81 MG PO TABS Oral Take 81 mg by mouth daily.      Marland Kitchen CALCIUM CARBONATE 600 MG PO TABS Oral Take 600 mg by mouth 2  (two) times daily with a meal.      . HYDROCODONE-ACETAMINOPHEN 7.5-500 MG PO TABS Oral Take 1 tablet by mouth every 6 (six) hours as needed. pain     . ISOSORBIDE MONONITRATE 20 MG PO TABS Oral Take 20 mg by mouth 2 (two) times daily.      . NORVASC 2.5 MG PO TABS  TAKE (1) TABLET BY MOUTH ONCE DAILY. 30 each 6  . OMEPRAZOLE 20 MG PO CPDR Oral Take 20 mg by mouth daily.      Marland Kitchen PSEUDOEPHEDRINE-ACETAMINOPHEN 30-500 MG PO TABS Oral Take 1 tablet by mouth every 4 (four) hours as needed.      Marland Kitchen SIMVASTATIN 40 MG PO TABS Oral Take 40 mg by mouth at bedtime.      Marland Kitchen VITAMIN B-12 1000 MCG PO TABS Oral Take 1,000 mcg by mouth daily.        BP 142/60  Temp(Src) 97.5 F (36.4 C) (Oral)  Resp 20  SpO2 96%  Physical Exam  Nursing note and vitals reviewed. 78 year old female is resting comfortably and in no acute distress. Vital signs show borderline hypertension with blood pressure 142/60 oxygen saturation is 96% which is normal. Head is normocephalic and atraumatic.  PERRLA, EOMI. Oropharynx is clear. Neck is nontender and supple without adenopathy or JVD. Back is nontender. Lungs have coarse rales at the right base. There no wheezes or rhonchi. There is no chest wall tenderness. Heart has regular rate rhythm without murmur. Abdomen is soft, flat, nontender without masses or hepatosplenomegaly. Extremities have trace edema, no cyanosis. Full range of motion is present. Skin is warm and moist without rash. Neurologic: She is awake and will alert but nonverbal and follows commands rather poorly. Cranial nerves are intact. There no focal motor or sensory deficits.  ED Course  Procedures (including critical care time)  Results for orders placed during the hospital encounter of 12/15/11  CBC      Component Value Range   WBC 4.8  4.0 - 10.5 (K/uL)   RBC 3.90  3.87 - 5.11 (MIL/uL)   Hemoglobin 13.0  12.0 - 15.0 (g/dL)   HCT 40.9  81.1 - 91.4 (%)   MCV 101.0 (*) 78.0 - 100.0 (fL)   MCH 33.3  26.0 - 34.0  (pg)   MCHC 33.0  30.0 - 36.0 (g/dL)   RDW 78.2  95.6 - 21.3 (%)   Platelets 139 (*) 150 - 400 (K/uL)  DIFFERENTIAL      Component Value Range   Neutrophils Relative 39 (*) 43 - 77 (%)   Neutro Abs 1.9  1.7 - 7.7 (K/uL)   Lymphocytes Relative 33  12 - 46 (%)   Lymphs Abs 1.6  0.7 - 4.0 (K/uL)   Monocytes Relative 10  3 - 12 (%)   Monocytes Absolute 0.5  0.1 - 1.0 (K/uL)   Eosinophils Relative 18 (*) 0 - 5 (%)   Eosinophils Absolute 0.9 (*) 0.0 - 0.7 (K/uL)   Basophils Relative 0  0 - 1 (%)   Basophils Absolute 0.0  0.0 - 0.1 (K/uL)  BASIC METABOLIC PANEL      Component Value Range   Sodium 139  135 - 145 (mEq/L)   Potassium 3.5  3.5 - 5.1 (mEq/L)   Chloride 101  96 - 112 (mEq/L)   CO2 29  19 - 32 (mEq/L)   Glucose, Bld 177 (*) 70 - 99 (mg/dL)   BUN 18  6 - 23 (mg/dL)   Creatinine, Ser 0.86 (*) 0.50 - 1.10 (mg/dL)   Calcium 57.8  8.4 - 10.5 (mg/dL)   GFR calc non Af Amer 37 (*) >90 (mL/min)   GFR calc Af Amer 43 (*) >90 (mL/min)  PRO B NATRIURETIC PEPTIDE      Component Value Range   Pro B Natriuretic peptide (BNP) 105.2  0 - 450 (pg/mL)  TROPONIN I      Component Value Range   Troponin I <0.30  <0.30 (ng/mL)  URINALYSIS, ROUTINE W REFLEX MICROSCOPIC      Component Value Range   Color, Urine YELLOW  YELLOW    APPearance CLEAR  CLEAR    Specific Gravity, Urine >1.030 (*) 1.005 - 1.030    pH 5.5  5.0 - 8.0    Glucose, UA 100 (*) NEGATIVE (mg/dL)   Hgb urine dipstick TRACE (*) NEGATIVE    Bilirubin Urine NEGATIVE  NEGATIVE    Ketones, ur NEGATIVE  NEGATIVE (mg/dL)   Protein, ur 469 (*) NEGATIVE (mg/dL)   Urobilinogen, UA 0.2  0.0 - 1.0 (mg/dL)   Nitrite NEGATIVE  NEGATIVE    Leukocytes, UA NEGATIVE  NEGATIVE   GLUCOSE, CAPILLARY      Component Value Range   Glucose-Capillary 149 (*)  70 - 99 (mg/dL)   Comment 1 Documented in Chart    URINE MICROSCOPIC-ADD ON      Component Value Range   Squamous Epithelial / LPF FEW (*) RARE    WBC, UA 0-2  <3 (WBC/hpf)   RBC / HPF  0-2  <3 (RBC/hpf)   Bacteria, UA MANY (*) RARE    Urine-Other AMORPHOUS URATES/PHOSPHATES     Dg Chest Portable 1 View  12/15/2011  *RADIOLOGY REPORT*  Clinical Data: Cough, shortness of breath  PORTABLE CHEST - 1 VIEW  Comparison: 10/10/2011  Findings: Low lung volumes.  Mild cardiomegaly stable.  Lungs are clear.  No effusion.  Tortuous atheromatous thoracic aorta. Regional bones unremarkable.  IMPRESSION:  Stable cardiomegaly.  No acute disease.  Original Report Authenticated By: Osa Craver, M.D.      Date: 12/15/2011  Rate: 62  Rhythm: normal sinus rhythm and premature atrial contractions (PAC)  QRS Axis: normal  Intervals: PR prolonged  ST/T Wave abnormalities: normal  Conduction Disutrbances:first-degree A-V block  and right bundle branch block  Narrative Interpretation: first degree AV block, right bundle branch block, low voltage. When compared with ECG of 10/10/2011, no significant changes are seen.  Old EKG Reviewed: unchanged  She has rested comfortably her entire stay in the emergency department. I have not seen her coughing. Workup is unremarkable except for bacteria in her urine. Urine will be sent for culture and she will be empirically treated for urinary tract infection with cephalexin. She is to followup with her PCP in 2 days for recheck, return if symptoms worsen.  1. Cough   2. Dyspnea   3. Urinary tract infection       MDM  Cough and dyspnea. This may represent bronchitis, may represent pneumonia, and your are present congestive heart failure. Laboratory workup, EKG, and chest x-ray been ordered.        Dione Booze, MD 12/15/11 [phone]

## 2011-12-15 NOTE — ED Notes (Signed)
Per EMS - increased weakness with cough, sob x 2 days.  Pt has hx of dementia, alert to self and place.  nad noted at this time.

## 2011-12-16 LAB — URINE CULTURE: Culture  Setup Time: 201302050330

## 2011-12-31 ENCOUNTER — Encounter (HOSPITAL_COMMUNITY): Payer: Self-pay | Admitting: Emergency Medicine

## 2011-12-31 ENCOUNTER — Emergency Department (HOSPITAL_COMMUNITY)
Admission: EM | Admit: 2011-12-31 | Discharge: 2011-12-31 | Disposition: A | Discharge status: Home / Self Care | Payer: Medicare Other | Attending: Emergency Medicine | Admitting: Emergency Medicine

## 2011-12-31 DIAGNOSIS — A084 Viral intestinal infection, unspecified: Secondary | ICD-10-CM

## 2011-12-31 DIAGNOSIS — M329 Systemic lupus erythematosus, unspecified: Secondary | ICD-10-CM | POA: Insufficient documentation

## 2011-12-31 DIAGNOSIS — I509 Heart failure, unspecified: Secondary | ICD-10-CM | POA: Insufficient documentation

## 2011-12-31 DIAGNOSIS — I4891 Unspecified atrial fibrillation: Secondary | ICD-10-CM | POA: Insufficient documentation

## 2011-12-31 DIAGNOSIS — F0280 Dementia in other diseases classified elsewhere without behavioral disturbance: Secondary | ICD-10-CM | POA: Insufficient documentation

## 2011-12-31 DIAGNOSIS — A0472 Enterocolitis due to Clostridium difficile, not specified as recurrent: Secondary | ICD-10-CM | POA: Insufficient documentation

## 2011-12-31 DIAGNOSIS — Z9851 Tubal ligation status: Secondary | ICD-10-CM | POA: Insufficient documentation

## 2011-12-31 DIAGNOSIS — Z9889 Other specified postprocedural states: Secondary | ICD-10-CM | POA: Insufficient documentation

## 2011-12-31 DIAGNOSIS — Z794 Long term (current) use of insulin: Secondary | ICD-10-CM | POA: Insufficient documentation

## 2011-12-31 DIAGNOSIS — E119 Type 2 diabetes mellitus without complications: Secondary | ICD-10-CM | POA: Insufficient documentation

## 2011-12-31 DIAGNOSIS — J4489 Other specified chronic obstructive pulmonary disease: Secondary | ICD-10-CM | POA: Insufficient documentation

## 2011-12-31 DIAGNOSIS — G309 Alzheimer's disease, unspecified: Secondary | ICD-10-CM | POA: Insufficient documentation

## 2011-12-31 DIAGNOSIS — J449 Chronic obstructive pulmonary disease, unspecified: Secondary | ICD-10-CM | POA: Insufficient documentation

## 2011-12-31 DIAGNOSIS — Z7982 Long term (current) use of aspirin: Secondary | ICD-10-CM | POA: Insufficient documentation

## 2011-12-31 HISTORY — DX: Reserved for concepts with insufficient information to code with codable children: IMO0002

## 2011-12-31 HISTORY — DX: Alzheimer's disease, unspecified: G30.9

## 2011-12-31 HISTORY — DX: Dementia in other diseases classified elsewhere, unspecified severity, without behavioral disturbance, psychotic disturbance, mood disturbance, and anxiety: F02.80

## 2011-12-31 HISTORY — DX: Systemic lupus erythematosus, unspecified: M32.9

## 2011-12-31 HISTORY — DX: Disorder of arteries and arterioles, unspecified: I77.9

## 2011-12-31 LAB — CBC
HCT: 38.2 % (ref 36.0–46.0)
Hemoglobin: 12.8 g/dL (ref 12.0–15.0)
MCH: 33.6 pg (ref 26.0–34.0)
MCHC: 33.5 g/dL (ref 30.0–36.0)

## 2011-12-31 LAB — BASIC METABOLIC PANEL
BUN: 25 mg/dL — ABNORMAL HIGH (ref 6–23)
Calcium: 9.7 mg/dL (ref 8.4–10.5)
Creatinine, Ser: 1.31 mg/dL — ABNORMAL HIGH (ref 0.50–1.10)
GFR calc non Af Amer: 36 mL/min — ABNORMAL LOW (ref >90)
Glucose, Bld: 268 mg/dL — ABNORMAL HIGH (ref 70–99)

## 2011-12-31 MED ORDER — SODIUM CHLORIDE 0.9 % IV SOLN
Freq: Once | INTRAVENOUS | Status: AC
Start: 2011-12-31 — End: 2011-12-31
  Administered 2011-12-31: 17:00:00 via INTRAVENOUS

## 2011-12-31 NOTE — ED Notes (Signed)
Red socks and fall bracelet placed. Family at bedside.

## 2011-12-31 NOTE — ED Notes (Addendum)
Unable to obtain stool specimen while in department.  EDP requested pt collect sample at home and return to lab.

## 2011-12-31 NOTE — ED Notes (Signed)
Pt resting quietly.  Family at bedside. No distress noted.  No needs verbalized.

## 2011-12-31 NOTE — ED Provider Notes (Signed)
History     CSN: [id number]  Arrival date & time 12/31/11  1524   First MD Initiated Contact with Patient 12/31/11 1531      Chief Complaint  Patient presents with  . Diarrhea    (Consider location/radiation/quality/duration/timing/severity/associated sxs/prior treatment) HPI   Patient is unable to provide a clear history due to hearing disability and memory loss due to Alzheimer's disease. Patient's daughter reports that she has had 2 days of watery diarrhea. She is concerned for dehydration since patient only drinks 1-2 glasses of liquid a day. She was  discharged from the ED with 30 pills of cephalexin starting 2 weeks ago. She was supposed to complete 10 day course on 2/14. Daughter denies any blood in her stool or black colored stools. She reports that patient had C. Diff 2 years ago after antibiotic use. She noted that her stool did not seem to have the strong odor it had at that time. Patient has not been hospitalized but was in the ED 16 days ago. Daughter denies any sick contacts. She lives with her son and has a caretaker.   Past Medical History  Diagnosis Date  . CHF (congestive heart failure)   . COPD (chronic obstructive pulmonary disease)   . Diabetes mellitus   . A-fib   . Lupus   . Alzheimer's dementia   . Aortic disease     Past Surgical History  Procedure Date  . Cholecystectomy   . Foot surgery   . Tubal ligation   . Tympanostomy tube placement     Family History  Problem Relation Age of Onset  . Diabetes Sister   . Diabetes Brother     History  Substance Use Topics  . Smoking status: Never Smoker   . Smokeless tobacco: Not on file  . Alcohol Use: No    OB History    Grav Para Term Preterm Abortions TAB SAB Ect Mult Living   13    2  2   9       Review of Systems  Allergies  Haloperidol lactate; Adhesive; and Lorazepam  Home Medications   Current Outpatient Rx  Name Route Sig Dispense Refill  . ALBUTEROL SULFATE (2.5 MG/3ML) 0.083%  IN NEBU Nebulization Take 2.5 mg by nebulization as needed. For shortness of breath    . ALLOPURINOL 100 MG PO TABS Oral Take 100 mg by mouth every morning.     Marland Kitchen ALPRAZOLAM 0.25 MG PO TABS Oral Take 0.25 mg by mouth 3 (three) times daily as needed. For anxiety     . ASPIRIN 81 MG PO CHEW Oral Chew 81 mg by mouth every morning.    Marland Kitchen CALCIUM 600 + D PO Oral Take 1 tablet by mouth 3 (three) times daily.    Marland Kitchen HYDROCODONE-ACETAMINOPHEN 7.5-500 MG PO TABS Oral Take 1 tablet by mouth every 6 (six) hours as needed. pain     . INSULIN LISPRO PROT & LISPRO (75-25) 100 UNIT/ML [place] SUSP Subcutaneous Inject into the skin 2 (two) times daily. Per sliding scale    . ISOSORBIDE MONONITRATE 20 MG PO TABS Oral Take 20 mg by mouth 2 (two) times daily.      . NORVASC 2.5 MG PO TABS  TAKE (1) TABLET BY MOUTH ONCE DAILY. 30 each 6  . OMEPRAZOLE 20 MG PO CPDR Oral Take 20 mg by mouth every morning.     Marland Kitchen SIMVASTATIN 20 MG PO TABS Oral Take 20 mg by mouth every morning.    Marland Kitchen  VITAMIN B-12 1000 MCG PO TABS Oral Take 1,000 mcg by mouth every morning.       BP 138/76  Pulse 61  Temp(Src) 97.7 F (36.5 C) (Oral)  Resp 20  SpO2 95%  Physical Exam General: elderly woman who is hearing impaired resting in bed in no apparent distress HEENT: PERRL, EOMI, no scleral icterus Cardiac: RRR, no rubs, murmurs or gallops Pulm: clear to auscultation bilaterally, moving normal volumes of air, sigh-type sound present with exhaling  Abd: soft, nontender, nondistended, BS active Ext: warm and well perfused, no pedal edema Neuro: alert and oriented X3, cranial nerves II-XII grossly intact   ED Course  Procedures (including critical care time)  Labs Reviewed - No data to display No results found.   No diagnosis found.    MDM  Will check C. Diff PCR and keep on precautions. Will replete fluids with bolus of normal saline and will check CBC and BMET for acute infection or electrolyte disturbance.        Margorie John, MD 12/31/11 1635

## 2011-12-31 NOTE — ED Notes (Signed)
Pt transported home via EMS

## 2011-12-31 NOTE — ED Notes (Signed)
Pt with 2 day hx of diarrhea. Unable to keep anything on stomach. Family concerned about pt being dehydrated.

## 2011-12-31 NOTE — Discharge Instructions (Signed)
Please bring in stool sample tomorrow. We will contact you with any abnormal results.

## 2012-01-01 NOTE — ED Provider Notes (Signed)
I saw and evaluated the patient, reviewed the resident's note and I agree with the findings and plan.   .Face to face Exam:  General:  Awake HEENT:  Atraumatic Resp:  Normal effort Abd:  Nondistended Neuro:No focal weakness Lymph: No adenopathy   Alizee Maple L Helyne Genther, MD 01/01/12 2307 

## 2012-01-01 NOTE — ED Notes (Signed)
Took Positive result from [facility] from the lab for Positive Cdiff by PCR. Phoned ED flow manager and [facility] with results, spoke with Steward Ros and she said she would be glad to take care of the result. Prior to speaking with Alexia Freestone, EDP notified,( Dr. Bebe Shaggy) and he suggested pts PMD call her in something. Attempted to call Dr. Sherril Croon but no answer x 2. ED flow manager aware. Called back to lab and spoke to [place] to inform her that positive result was relayed to ED flow Manager.

## 2012-02-26 ENCOUNTER — Encounter (HOSPITAL_COMMUNITY): Payer: Self-pay | Admitting: *Deleted

## 2012-02-26 ENCOUNTER — Emergency Department (HOSPITAL_COMMUNITY)
Admission: EM | Admit: 2012-02-26 | Discharge: 2012-02-26 | Disposition: A | Discharge status: Home / Self Care | Payer: Medicare Other | Attending: Emergency Medicine | Admitting: Emergency Medicine

## 2012-02-26 ENCOUNTER — Emergency Department (HOSPITAL_COMMUNITY): Payer: Medicare Other

## 2012-02-26 DIAGNOSIS — W19XXXA Unspecified fall, initial encounter: Secondary | ICD-10-CM | POA: Insufficient documentation

## 2012-02-26 DIAGNOSIS — Z794 Long term (current) use of insulin: Secondary | ICD-10-CM | POA: Insufficient documentation

## 2012-02-26 DIAGNOSIS — G309 Alzheimer's disease, unspecified: Secondary | ICD-10-CM | POA: Insufficient documentation

## 2012-02-26 DIAGNOSIS — J4489 Other specified chronic obstructive pulmonary disease: Secondary | ICD-10-CM | POA: Insufficient documentation

## 2012-02-26 DIAGNOSIS — Y9210 Unspecified residential institution as the place of occurrence of the external cause: Secondary | ICD-10-CM | POA: Insufficient documentation

## 2012-02-26 DIAGNOSIS — E119 Type 2 diabetes mellitus without complications: Secondary | ICD-10-CM | POA: Insufficient documentation

## 2012-02-26 DIAGNOSIS — M7989 Other specified soft tissue disorders: Secondary | ICD-10-CM | POA: Insufficient documentation

## 2012-02-26 DIAGNOSIS — M329 Systemic lupus erythematosus, unspecified: Secondary | ICD-10-CM | POA: Insufficient documentation

## 2012-02-26 DIAGNOSIS — I509 Heart failure, unspecified: Secondary | ICD-10-CM | POA: Insufficient documentation

## 2012-02-26 DIAGNOSIS — I10 Essential (primary) hypertension: Secondary | ICD-10-CM | POA: Insufficient documentation

## 2012-02-26 DIAGNOSIS — I4891 Unspecified atrial fibrillation: Secondary | ICD-10-CM | POA: Insufficient documentation

## 2012-02-26 DIAGNOSIS — E7800 Pure hypercholesterolemia, unspecified: Secondary | ICD-10-CM | POA: Insufficient documentation

## 2012-02-26 DIAGNOSIS — M79673 Pain in unspecified foot: Secondary | ICD-10-CM

## 2012-02-26 DIAGNOSIS — M79609 Pain in unspecified limb: Secondary | ICD-10-CM | POA: Insufficient documentation

## 2012-02-26 DIAGNOSIS — F0280 Dementia in other diseases classified elsewhere without behavioral disturbance: Secondary | ICD-10-CM | POA: Insufficient documentation

## 2012-02-26 DIAGNOSIS — J449 Chronic obstructive pulmonary disease, unspecified: Secondary | ICD-10-CM | POA: Insufficient documentation

## 2012-02-26 HISTORY — DX: Pure hypercholesterolemia, unspecified: E78.00

## 2012-02-26 HISTORY — DX: Essential (primary) hypertension: I10

## 2012-02-26 NOTE — Discharge Instructions (Signed)
Use the post op shoe to see if will help with he discomfort when you try and stand her. The discomfort will improve as the bruising resolves.

## 2012-02-26 NOTE — ED Notes (Signed)
edp talking with family regarding x-rays

## 2012-02-26 NOTE — ED Provider Notes (Signed)
History     CSN: [id number]  Arrival date & time 02/26/12  1243   First MD Initiated Contact with Patient 02/26/12 1345      Chief Complaint  Patient presents with  . Foot Pain    (Consider location/radiation/quality/duration/timing/severity/associated sxs/prior treatment) HPI Level 5 caveat due to dementia [name] is a 78 y.o. female history of CHF, COPD, diabetes, atrial fibrillation, lupus, Alzheimer's dementia, hypertension, gout who is brought to the  the Emergency Department by her daughter with a request to xray her left foot. Patient had fallen two weeks ago while at nursing facility in [place]. Foot has been swollen with bruising to the top of the foot since. Patient is not ambulatory.  PCP Dr. Sherril Croon  Past Medical History  Diagnosis Date  . CHF (congestive heart failure)   . COPD (chronic obstructive pulmonary disease)   . Diabetes mellitus   . A-fib   . Lupus   . Alzheimer's dementia   . Aortic disease   . Hypertension   . Gout   . High cholesterol     Past Surgical History  Procedure Date  . Cholecystectomy   . Foot surgery   . Tubal ligation   . Tympanostomy tube placement     Family History  Problem Relation Age of Onset  . Diabetes Sister   . Diabetes Brother     History  Substance Use Topics  . Smoking status: Never Smoker   . Smokeless tobacco: Not on file  . Alcohol Use: No    OB History    Grav Para Term Preterm Abortions TAB SAB Ect Mult Living   13    2  2   9       Review of Systems  Unable to perform ROS: Dementia    Allergies  Haloperidol lactate; Adhesive; and Lorazepam  Home Medications   Current Outpatient Rx  Name Route Sig Dispense Refill  . ALBUTEROL SULFATE (2.5 MG/3ML) 0.083% IN NEBU Nebulization Take 2.5 mg by nebulization as needed. For shortness of breath    . ALLOPURINOL 100 MG PO TABS Oral Take 100 mg by mouth every morning.     Marland Kitchen ALPRAZOLAM 0.25 MG PO TABS Oral Take 0.25 mg by mouth daily as needed. For  anxiety. **GIVE ONE TABLET AT BEDTIME. MAY GIVE ONE TABLET DAILY AS NEEDED**    . AMLODIPINE BESYLATE 2.5 MG PO TABS Oral Take 2.5 mg by mouth daily.     . ASPIRIN 81 MG PO CHEW Oral Chew 81 mg by mouth every morning.    Marland Kitchen CALCIUM 600 + D PO Oral Take 1 tablet by mouth 3 (three) times daily.    Marland Kitchen HYDROCODONE-ACETAMINOPHEN 7.5-325 MG PO TABS Oral Take 1 tablet by mouth 2 (two) times daily.    . INSULIN ASPART PROT & ASPART (70-30) 100 UNIT/ML [place] SUSP Subcutaneous Inject into the skin as directed. **MORNING DOSE: IF BLOOD SUGAR IS 150 OR MORE=36 UNITS-IF 150 AND UNDER GIVE 32 UNITS. EVENING DOSE: IF BLOOD SUGAR IS LOWER THAN 150 GIVE 26 UNITS. IF LEVELS ARE 150 OR MORE GIVE 30 UNITS**    . ISOSORBIDE MONONITRATE 20 MG PO TABS Oral Take 20 mg by mouth 2 (two) times daily.      Marland Kitchen OMEPRAZOLE 20 MG PO CPDR Oral Take 20 mg by mouth every morning.     Marland Kitchen SIMVASTATIN 20 MG PO TABS Oral Take 20 mg by mouth 2 (two) times daily.     Marland Kitchen VITAMIN B-12 1000  MCG PO TABS Oral Take 1,000 mcg by mouth every morning.       BP 142/62  Pulse 77  Temp(Src) 98.1 F (36.7 C) (Oral)  Resp 18  Wt 200 lb (90.719 kg)  SpO2 95%  Physical Exam  Nursing note and vitals reviewed. Constitutional:       Awake, alert, nontoxic appearance.  HENT:  Head: Atraumatic.  Eyes: Right eye exhibits no discharge. Left eye exhibits no discharge.  Neck: Neck supple.  Pulmonary/Chest: Effort normal. She exhibits no tenderness.  Abdominal: Soft. There is no tenderness. There is no rebound.  Musculoskeletal: She exhibits no tenderness.       Baseline ROM, no obvious new focal weakness.  Neurological:       Mental status and motor strength appears baseline for patient and situation.  Skin: No rash noted.       Bruising to left face, neck, shoulder, chest wall, thigh, foot in various stages of healing.  Psychiatric: She has a normal mood and affect.    ED Course  Procedures (including critical care time) Results for orders placed  during the hospital encounter of 02/26/12  GLUCOSE, CAPILLARY      Component Value Range   Glucose-Capillary 156 (*) 70 - 99 (mg/dL)   Dg Foot Complete Left  02/26/2012  *RADIOLOGY REPORT*  Clinical Data:  Left foot bruising post fall, unable to bear weight  LEFT FOOT - COMPLETE 3+ VIEW  Comparison: None  Findings: Severe osseous demineralization. Joint spaces preserved. Scattered small vessel vascular calcifications. Questionable cortical destruction and lucency at the medial cuneiform, potentially artifact but subtle fracture not excluded. No additional fracture, dislocation, or bone destruction.  IMPRESSION: Severe osseous demineralization. Questionable nondisplaced fracture versus artifact at medial cuneiform; recommend clinical correlation for pain at this site.  Original Report Authenticated By: Lollie Marrow, M.D.       MDM  Patient who fell two weeks ago sustaining bruising to the left side of her body. Left foot remained bruised and sore. Requested evaluation. Xray shows a questionable nondisplaced fx vs artifact at medial cuniform. Since the patient is not ambulatory, no splinting is required. Will provide post op shoe. Reviewed results with daughter and nurse aide that cares for the patient.Pt stable in ED with no significant deterioration in condition.The patient appears reasonably screened and/or stabilized for discharge and I doubt any other medical condition or other [facility] requiring further screening, evaluation, or treatment in the ED at this time prior to discharge.  MDM Reviewed: nursing note and vitals Interpretation: x-ray          Nicoletta Dress. Colon Branch, MD 02/26/12 [phone]

## 2012-02-26 NOTE — ED Notes (Signed)
Per EMS - pt fell in nursing home approx 2 wks ago, having multiple bruises to left side.  Seen and treated at [facility].  Here today with bruising/swelling to left foot.  Daughter reports bruising has been there since fall.  Reports no x-rays were completed of left foot.  Pt is non-ambulatory at baseline.  Pt alert, with hx of dementia.  Bruising, purple and yellow, noted to left foot with minimal swelling.  Left pedal pulse palpated and wnl.  Pt also has bruising noted to left side of face and left arm in various stages of healing.  nad noted.  Family at bedside.

## 2012-02-26 NOTE — ED Notes (Signed)
ems called at families req to transport pt home

## 2012-03-20 ENCOUNTER — Inpatient Hospital Stay (HOSPITAL_COMMUNITY)
Admission: EM | Admit: 2012-03-20 | Discharge: 2012-03-22 | DRG: 191 | Disposition: A | Discharge status: Home Health | Payer: Medicare Other | Attending: Internal Medicine | Admitting: Internal Medicine

## 2012-03-20 ENCOUNTER — Emergency Department (HOSPITAL_COMMUNITY): Payer: Medicare Other

## 2012-03-20 ENCOUNTER — Other Ambulatory Visit: Payer: Self-pay

## 2012-03-20 ENCOUNTER — Encounter (HOSPITAL_COMMUNITY): Payer: Self-pay | Admitting: Emergency Medicine

## 2012-03-20 DIAGNOSIS — F0280 Dementia in other diseases classified elsewhere without behavioral disturbance: Secondary | ICD-10-CM | POA: Diagnosis present

## 2012-03-20 DIAGNOSIS — R6251 Failure to thrive (child): Secondary | ICD-10-CM

## 2012-03-20 DIAGNOSIS — I5032 Chronic diastolic (congestive) heart failure: Secondary | ICD-10-CM

## 2012-03-20 DIAGNOSIS — F0390 Unspecified dementia without behavioral disturbance: Secondary | ICD-10-CM

## 2012-03-20 DIAGNOSIS — I959 Hypotension, unspecified: Secondary | ICD-10-CM

## 2012-03-20 DIAGNOSIS — I10 Essential (primary) hypertension: Secondary | ICD-10-CM

## 2012-03-20 DIAGNOSIS — J440 Chronic obstructive pulmonary disease with acute lower respiratory infection: Principal | ICD-10-CM | POA: Diagnosis present

## 2012-03-20 DIAGNOSIS — Z7401 Bed confinement status: Secondary | ICD-10-CM

## 2012-03-20 DIAGNOSIS — A088 Other specified intestinal infections: Secondary | ICD-10-CM

## 2012-03-20 DIAGNOSIS — R0602 Shortness of breath: Secondary | ICD-10-CM

## 2012-03-20 DIAGNOSIS — E876 Hypokalemia: Secondary | ICD-10-CM | POA: Diagnosis present

## 2012-03-20 DIAGNOSIS — J209 Acute bronchitis, unspecified: Principal | ICD-10-CM | POA: Diagnosis present

## 2012-03-20 DIAGNOSIS — K5289 Other specified noninfective gastroenteritis and colitis: Secondary | ICD-10-CM | POA: Diagnosis present

## 2012-03-20 DIAGNOSIS — R5381 Other malaise: Secondary | ICD-10-CM | POA: Diagnosis present

## 2012-03-20 DIAGNOSIS — E119 Type 2 diabetes mellitus without complications: Secondary | ICD-10-CM | POA: Diagnosis present

## 2012-03-20 DIAGNOSIS — E860 Dehydration: Secondary | ICD-10-CM | POA: Diagnosis present

## 2012-03-20 DIAGNOSIS — R0989 Other specified symptoms and signs involving the circulatory and respiratory systems: Secondary | ICD-10-CM

## 2012-03-20 DIAGNOSIS — I2510 Atherosclerotic heart disease of native coronary artery without angina pectoris: Secondary | ICD-10-CM

## 2012-03-20 DIAGNOSIS — I498 Other specified cardiac arrhythmias: Secondary | ICD-10-CM

## 2012-03-20 DIAGNOSIS — B349 Viral infection, unspecified: Secondary | ICD-10-CM

## 2012-03-20 DIAGNOSIS — Z833 Family history of diabetes mellitus: Secondary | ICD-10-CM

## 2012-03-20 DIAGNOSIS — I080 Rheumatic disorders of both mitral and aortic valves: Secondary | ICD-10-CM

## 2012-03-20 DIAGNOSIS — Z950 Presence of cardiac pacemaker: Secondary | ICD-10-CM

## 2012-03-20 DIAGNOSIS — Z66 Do not resuscitate: Secondary | ICD-10-CM | POA: Diagnosis present

## 2012-03-20 DIAGNOSIS — E440 Moderate protein-calorie malnutrition: Secondary | ICD-10-CM | POA: Diagnosis present

## 2012-03-20 DIAGNOSIS — G309 Alzheimer's disease, unspecified: Secondary | ICD-10-CM | POA: Diagnosis present

## 2012-03-20 LAB — CARDIAC PANEL(CRET KIN+CKTOT+MB+TROPI)
Relative Index: INVALID (ref 0.0–2.5)
Troponin I: <0.3 ng/mL (ref <0.30)

## 2012-03-20 LAB — URINALYSIS, ROUTINE W REFLEX MICROSCOPIC
Glucose, UA: 100 mg/dL — AB
Leukocytes, UA: NEGATIVE
Nitrite: NEGATIVE
Protein, ur: 100 mg/dL — AB

## 2012-03-20 LAB — HEPATIC FUNCTION PANEL
AST: 33 U/L (ref 0–37)
Albumin: 2.6 g/dL — ABNORMAL LOW (ref 3.5–5.2)
Bilirubin, Direct: <0.1 mg/dL (ref 0.0–0.3)

## 2012-03-20 LAB — CBC
Hemoglobin: 11.6 g/dL — ABNORMAL LOW (ref 12.0–15.0)
RBC: 3.54 MIL/uL — ABNORMAL LOW (ref 3.87–5.11)

## 2012-03-20 LAB — BASIC METABOLIC PANEL
BUN: 14 mg/dL (ref 6–23)
Chloride: 99 mEq/L (ref 96–112)
GFR calc Af Amer: 47 mL/min — ABNORMAL LOW (ref >90)
Glucose, Bld: 193 mg/dL — ABNORMAL HIGH (ref 70–99)
Potassium: 3.8 mEq/L (ref 3.5–5.1)

## 2012-03-20 LAB — DIFFERENTIAL
Lymphs Abs: 1.3 10*3/uL (ref 0.7–4.0)
Monocytes Relative: 10 % (ref 3–12)
Neutro Abs: 1.6 10*3/uL — ABNORMAL LOW (ref 1.7–7.7)
Neutrophils Relative %: 40 % — ABNORMAL LOW (ref 43–77)

## 2012-03-20 LAB — URINE MICROSCOPIC-ADD ON

## 2012-03-20 MED ORDER — ONDANSETRON HCL 4 MG/2ML IJ SOLN
4.0000 mg | Freq: Once | INTRAMUSCULAR | Status: AC
  Administered 2012-03-20: 4 mg via INTRAVENOUS
  Filled 2012-03-20: qty 2

## 2012-03-20 MED ORDER — SODIUM CHLORIDE 0.9 % IV BOLUS (SEPSIS)
500.0000 mL | Freq: Once | INTRAVENOUS | Status: AC
  Administered 2012-03-20: 500 mL via INTRAVENOUS

## 2012-03-20 MED ORDER — SODIUM CHLORIDE 0.9 % IV SOLN: INTRAVENOUS | Status: DC

## 2012-03-20 NOTE — ED Provider Notes (Signed)
History  This chart was scribed for Donnetta Hutching, MD by Bennett Scrape. This patient was seen in room APA05/APA05 and the patient's care was started at 6:54PM.  CSN: [id number]  Arrival date & time 03/20/12  1846   First MD Initiated Contact with Patient 03/20/12 1854     Level 5 Caveat- Pt has a h/o dementia   Chief Complaint  Patient presents with  . Chest Pain    The history is provided by the patient. No language interpreter was used.    [name] is a 78 y.o. female with a h/o dementia brought in by ambulance, who presents to the Emergency Department complaining of 2 days of emesis with associated nausea and central abdominal pain. Per EMS, pt was c/o chest pain PTA. She denies chest pain currently. She denies trouble urinating or having BMs. She denies any h/o abdominal surgeries. She also has a h/o CHF, COPD, diabetes, and HTN. She denies smoking and alcohol use.  Per son, pt had a cough and started complaining of chest pain several days ago. Son also states that pt became more SOB. Son believes that pt has pneumonia. She has been bed ridden for several years now.   Dr. Britta Mccreedy in [place].  Past Medical History  Diagnosis Date  . CHF (congestive heart failure)   . COPD (chronic obstructive pulmonary disease)   . Diabetes mellitus   . A-fib   . Lupus   . Alzheimer's dementia   . Aortic disease   . Hypertension   . Gout   . High cholesterol     Past Surgical History  Procedure Date  . Cholecystectomy   . Foot surgery   . Tubal ligation   . Tympanostomy tube placement     Family History  Problem Relation Age of Onset  . Diabetes Sister   . Diabetes Brother     History  Substance Use Topics  . Smoking status: Never Smoker   . Smokeless tobacco: Not on file  . Alcohol Use: No    OB History    Grav Para Term Preterm Abortions TAB SAB Ect Mult Living   13    2  2   9       Review of Systems  Unable to perform ROS: Dementia    Allergies  Haloperidol  lactate; Adhesive; and Lorazepam  Home Medications   Current Outpatient Rx  Name Route Sig Dispense Refill  . ALBUTEROL SULFATE (2.5 MG/3ML) 0.083% IN NEBU Nebulization Take 2.5 mg by nebulization as needed. For shortness of breath    . ALLOPURINOL 100 MG PO TABS Oral Take 100 mg by mouth every morning.     Marland Kitchen ALPRAZOLAM 0.25 MG PO TABS Oral Take 0.25 mg by mouth daily as needed. For anxiety. **GIVE ONE TABLET AT BEDTIME. MAY GIVE ONE TABLET DAILY AS NEEDED**    . AMLODIPINE BESYLATE 2.5 MG PO TABS Oral Take 2.5 mg by mouth daily.     . ASPIRIN 81 MG PO CHEW Oral Chew 81 mg by mouth every morning.    Marland Kitchen CALCIUM 600 + D PO Oral Take 1 tablet by mouth 3 (three) times daily.    Marland Kitchen HYDROCODONE-ACETAMINOPHEN 7.5-325 MG PO TABS Oral Take 1 tablet by mouth 2 (two) times daily.    . INSULIN ASPART PROT & ASPART (70-30) 100 UNIT/ML [place] SUSP Subcutaneous Inject into the skin as directed. **MORNING DOSE: IF BLOOD SUGAR IS 150 OR MORE=36 UNITS-IF 150 AND UNDER GIVE 32 UNITS. EVENING DOSE:  IF BLOOD SUGAR IS LOWER THAN 150 GIVE 26 UNITS. IF LEVELS ARE 150 OR MORE GIVE 30 UNITS**    . ISOSORBIDE MONONITRATE 20 MG PO TABS Oral Take 20 mg by mouth 2 (two) times daily.      Marland Kitchen OMEPRAZOLE 20 MG PO CPDR Oral Take 20 mg by mouth every morning.     Marland Kitchen SIMVASTATIN 20 MG PO TABS Oral Take 20 mg by mouth 2 (two) times daily.     Marland Kitchen VITAMIN B-12 1000 MCG PO TABS Oral Take 1,000 mcg by mouth every morning.       Triage Vitals: BP 166/92  Pulse 66  Temp 98.6 F (37 C)  Resp 18  Ht 5\' 3"  (1.6 m)  Wt 220 lb (99.791 kg)  BMI 38.97 kg/m2  SpO2 97%  Physical Exam  Nursing note and vitals reviewed. Constitutional: She appears well-developed and well-nourished.       Slightly dehydrated  HENT:  Head: Normocephalic and atraumatic.  Eyes: Conjunctivae and EOM are normal. Pupils are equal, round, and reactive to light.  Neck: Normal range of motion. Neck supple.  Cardiovascular: Normal rate and regular rhythm.     Pulmonary/Chest: Effort normal and breath sounds normal.  Abdominal: Soft. Bowel sounds are normal. There is no tenderness.  Musculoskeletal: Normal range of motion. She exhibits no edema.  Neurological: She is alert.  Skin: Skin is warm and dry.  Psychiatric:       Demented     ED Course  Procedures (including critical care time)  DIAGNOSTIC STUDIES: Oxygen Saturation is 97% on room air, adequate by my interpretation.    COORDINATION OF CARE: 7:05PM-Discussed IV fluids, urinalysis, 3-view abdominal x-ray and blood work with pt and pt agreed to plan. 8:58PM-Pt's son states that pt is not her normal self and he is requesting for her to be admitted. Will get an IV going and order a chest x-ray.    Labs Reviewed  CBC - Abnormal; Notable for the following:    RBC 3.54 (*)    Hemoglobin 11.6 (*)    HCT 35.9 (*)    MCV 101.4 (*)    Platelets 141 (*)    All other components within normal limits  DIFFERENTIAL - Abnormal; Notable for the following:    Neutrophils Relative 40 (*)    Neutro Abs 1.6 (*)    Eosinophils Relative 17 (*)    All other components within normal limits  BASIC METABOLIC PANEL - Abnormal; Notable for the following:    Sodium 134 (*)    Glucose, Bld 193 (*)    Creatinine, Ser 1.19 (*)    GFR calc non Af Amer 40 (*)    GFR calc Af Amer 47 (*)    All other components within normal limits  CARDIAC PANEL(CRET KIN+CKTOT+MB+TROPI)   Dg Abd Acute W/chest  03/20/2012  *RADIOLOGY REPORT*  Clinical Data:  Nausea and vomiting.  Shortness of breath and cough.  ACUTE ABDOMEN SERIES (ABDOMEN 2 VIEW & CHEST 1 VIEW)  Comparison: Portable chest 12/15/2011.  Findings: The heart is enlarged.  There is no edema or effusion to suggest failure.  The lungs are clear.  Supine and upright views of the abdomen demonstrate a nonspecific bowel gas pattern.  There is no evidence for obstruction or free air.  Degenerative changes are noted in the lumbar spine and right greater than left hip  and SI joints.  Atherosclerotic calcifications are noted without evidence for aneurysm.  IMPRESSION:  1.  Cardiomegaly  without failure. 2.  Nonspecific bowel gas pattern. 3.  Atherosclerosis.  Original Report Authenticated By: Jamesetta Orleans. MATTERN, M.D.     No diagnosis found.  Date: 03/20/2012  Rate: 66  Rhythm: normal sinus rhythm  QRS Axis: left  Intervals: normal  ST/T Wave abnormalities: normal  Conduction Disutrbances:right bundle branch block  Narrative Interpretation:   Old EKG Reviewed: changes noted    MDM  Son reports vomiting, coughing, decreased oral intake, dehydration. Admit for failure to thrive     I personally performed the services described in this documentation, which was scribed in my presence. The recorded information has been reviewed and considered.    Donnetta Hutching, MD 03/20/12 2211

## 2012-03-20 NOTE — ED Notes (Signed)
Pt c/o cp at home pta. Denies cp at this time.

## 2012-03-20 NOTE — H&P (Signed)
[name] is an 78 y.o. female.    PCP: Ignatius Specking., MD, MD   Chief Complaint: Shortness of breath, cough  HPI: This is 78 year old, Caucasian female, with a past medical history of hypertension, nonobstructive three-vessel coronary artery disease, dementia. Unfortunately due to her dementia patient is unable to provide history. I did speak with her daughter, who is also her power of attorney. Her daughter's name is [name]. She can be reached at [phone]. Patient was brought in by her son because of a history of cough, shortness of breath over the last few days. There was some mention of chest pain as well. There has been some mention of nausea, vomiting, and a few episodes of diarrhea. Patient denies any chest pain at this time. She tells me that her back bothers her at times. The daughter was unable to tell me if the patient has lost any weight. She is on antibiotics, which was started for a urinary tract infection that was diagnosed recently. She still has one day of the antibiotic left. However, the daughter couldn't tell me the name of the antibiotic. So, it appears the main symptoms that she was sent over was cough and shortness of breath. Patient is comfortable at this, time, and, in no distress. History once, again, is very limited.   Home Medications: Prior to Admission medications   Medication Sig Start Date End Date Taking? Authorizing Provider  albuterol (PROVENTIL) (2.5 MG/3ML) 0.083% nebulizer solution Take 2.5 mg by nebulization as needed. For shortness of breath   Yes Historical Provider, MD  allopurinol (ZYLOPRIM) 100 MG tablet Take 100 mg by mouth every morning.    Yes Historical Provider, MD  ALPRAZolam (XANAX) 0.25 MG tablet Take 0.25 mg by mouth daily as needed. For anxiety. **GIVE ONE TABLET AT BEDTIME. MAY GIVE ONE TABLET DAILY AS NEEDED**   Yes Historical Provider, MD  amLODipine (NORVASC) 2.5 MG tablet Take 2.5 mg by mouth daily.  10/13/11  Yes June Leap, MD    aspirin 81 MG chewable tablet Chew 81 mg by mouth every morning.   Yes Historical Provider, MD  Calcium Carbonate-Vitamin D (CALCIUM 600 + D PO) Take 1 tablet by mouth 3 (three) times daily.   Yes Historical Provider, MD  HYDROcodone-acetaminophen (NORCO) 7.5-325 MG per tablet Take 1 tablet by mouth as needed. Patient takes a 1/2 tablet as needed for pain   Yes Historical Provider, MD  insulin aspart protamine-insulin aspart (NOVOLOG MIX 70/30 FLEXPEN) (70-30) 100 UNIT/ML injection Inject into the skin as directed. **MORNING DOSE: IF BLOOD SUGAR IS 150 OR MORE=36 UNITS-IF 150 AND UNDER GIVE 32 UNITS. EVENING DOSE: IF BLOOD SUGAR IS LOWER THAN 150 GIVE 26 UNITS. IF LEVELS ARE 150 OR MORE GIVE 30 UNITS**   Yes Historical Provider, MD  isosorbide mononitrate (ISMO,MONOKET) 20 MG tablet Take 20 mg by mouth 2 (two) times daily.     Yes Historical Provider, MD  omeprazole (PRILOSEC) 20 MG capsule Take 20 mg by mouth every morning.    Yes Historical Provider, MD  simvastatin (ZOCOR) 20 MG tablet Take 20 mg by mouth 2 (two) times daily.    Yes Historical Provider, MD  vitamin B-12 (CYANOCOBALAMIN) 1000 MCG tablet Take 1,000 mcg by mouth every morning.    Yes Historical Provider, MD    Allergies:  Allergies  Allergen Reactions  . Haloperidol Lactate     REACTION: aggressive (HALDOL)  . Adhesive (Tape)   . Lorazepam     REACTION:  hallucinate (ATIVAN)    Past Medical History: Past Medical History  Diagnosis Date  . CHF (congestive heart failure)   . COPD (chronic obstructive pulmonary disease)   . Diabetes mellitus   . A-fib   . Lupus   . Alzheimer's dementia   . Aortic disease   . Hypertension   . Gout   . High cholesterol     Past Surgical History  Procedure Date  . Cholecystectomy   . Foot surgery   . Tubal ligation   . Tympanostomy tube placement     Social History:  reports that she has never smoked. She does not have any smokeless tobacco history on file. She reports that  she does not drink alcohol. Her drug history not on file.  Family History:  Family History  Problem Relation Age of Onset  . Diabetes Sister   . Diabetes Brother     Review of Systems - unobtainable from patient due to mental status  Physical Examination Blood pressure 155/63, pulse 62, temperature 98.6 F (37 C), resp. rate 20, height 5\' 3"  (1.6 m), weight 99.791 kg (220 lb), SpO2 96.00%.  General appearance: alert, cooperative, distracted, no distress and hard of hearing Head: Normocephalic, without obvious abnormality, atraumatic Eyes: conjunctivae/corneas clear. PERRL, EOM's intact.  Throat: lips, mucosa, and tongue normal; teeth and gums normal Neck: no adenopathy, no carotid bruit, no JVD, supple, symmetrical, trachea midline and thyroid not enlarged, symmetric, no tenderness/mass/nodules Back: symmetric, no curvature. ROM normal. No CVA tenderness. Resp: End expiratory wheezing heard bilaterally. Few crackles at the bases. Cardio: regular rate and rhythm, S1, S2 normal, no murmur, click, rub or gallop GI: soft, non-tender; bowel sounds normal; no masses,  no organomegaly Extremities: extremities normal, atraumatic, no cyanosis or edema Pulses: 2+ and symmetric Skin: Skin color, texture, turgor normal. No rashes or lesions Lymph nodes: Cervical, supraclavicular, and axillary nodes normal. Neurologic: She is alert. Extremely hard of hearing. Disoriented. No focal neurological deficits otherwise present.  Laboratory Data: Results for orders placed during the hospital encounter of 03/20/12 (from the past 48 hour(s))  CBC     Status: Abnormal   Collection Time   03/20/12  6:56 PM      Component Value Range Comment   WBC 4.0  4.0 - 10.5 (K/uL)    RBC 3.54 (*) 3.87 - 5.11 (MIL/uL)    Hemoglobin 11.6 (*) 12.0 - 15.0 (g/dL)    HCT 40.9 (*) 81.1 - 46.0 (%)    MCV 101.4 (*) 78.0 - 100.0 (fL)    MCH 32.8  26.0 - 34.0 (pg)    MCHC 32.3  30.0 - 36.0 (g/dL)    RDW 91.4  78.2 - 95.6  (%)    Platelets 141 (*) 150 - 400 (K/uL)   DIFFERENTIAL     Status: Abnormal   Collection Time   03/20/12  6:56 PM      Component Value Range Comment   Neutrophils Relative 40 (*) 43 - 77 (%)    Neutro Abs 1.6 (*) 1.7 - 7.7 (K/uL)    Lymphocytes Relative 33  12 - 46 (%)    Lymphs Abs 1.3  0.7 - 4.0 (K/uL)    Monocytes Relative 10  3 - 12 (%)    Monocytes Absolute 0.4  0.1 - 1.0 (K/uL)    Eosinophils Relative 17 (*) 0 - 5 (%)    Eosinophils Absolute 0.7  0.0 - 0.7 (K/uL)    Basophils Relative 0  0 - 1 (%)  Basophils Absolute 0.0  0.0 - 0.1 (K/uL)   BASIC METABOLIC PANEL     Status: Abnormal   Collection Time   03/20/12  6:56 PM      Component Value Range Comment   Sodium 134 (*) 135 - 145 (mEq/L)    Potassium 3.8  3.5 - 5.1 (mEq/L)    Chloride 99  96 - 112 (mEq/L)    CO2 26  19 - 32 (mEq/L)    Glucose, Bld 193 (*) 70 - 99 (mg/dL)    BUN 14  6 - 23 (mg/dL)    Creatinine, Ser 4.09 (*) 0.50 - 1.10 (mg/dL)    Calcium 9.5  8.4 - 10.5 (mg/dL)    GFR calc non Af Amer 40 (*) >90 (mL/min)    GFR calc Af Amer 47 (*) >90 (mL/min)   CARDIAC PANEL(CRET KIN+CKTOT+MB+TROPI)     Status: Normal   Collection Time   03/20/12  6:56 PM      Component Value Range Comment   Total CK 37  7 - 177 (U/L)    CK, MB 1.7  0.3 - 4.0 (ng/mL)    Troponin I <0.30  <0.30 (ng/mL)    Relative Index RELATIVE INDEX IS INVALID  0.0 - 2.5    HEPATIC FUNCTION PANEL     Status: Abnormal   Collection Time   03/20/12  6:56 PM      Component Value Range Comment   Total Protein 6.8  6.0 - 8.3 (g/dL)    Albumin 2.6 (*) 3.5 - 5.2 (g/dL)    AST 33  0 - 37 (U/L) SLIGHT HEMOLYSIS   ALT 12  0 - 35 (U/L)    Alkaline Phosphatase 87  39 - 117 (U/L)    Total Bilirubin 0.2 (*) 0.3 - 1.2 (mg/dL)    Bilirubin, Direct <8.1  0.0 - 0.3 (mg/dL)    Indirect Bilirubin NOT CALCULATED  0.3 - 0.9 (mg/dL)   LIPASE, BLOOD     Status: Normal   Collection Time   03/20/12  6:56 PM      Component Value Range Comment   Lipase 37  11 - 59  (U/L)   URINALYSIS, ROUTINE W REFLEX MICROSCOPIC     Status: Abnormal   Collection Time   03/20/12  9:15 PM      Component Value Range Comment   Color, Urine YELLOW  YELLOW     APPearance CLEAR  CLEAR     Specific Gravity, Urine >1.030 (*) 1.005 - 1.030     pH 6.0  5.0 - 8.0     Glucose, UA 100 (*) NEGATIVE (mg/dL)    Hgb urine dipstick TRACE (*) NEGATIVE     Bilirubin Urine NEGATIVE  NEGATIVE     Ketones, ur NEGATIVE  NEGATIVE (mg/dL)    Protein, ur 191 (*) NEGATIVE (mg/dL)    Urobilinogen, UA 0.2  0.0 - 1.0 (mg/dL)    Nitrite NEGATIVE  NEGATIVE     Leukocytes, UA NEGATIVE  NEGATIVE    URINE MICROSCOPIC-ADD ON     Status: Abnormal   Collection Time   03/20/12  9:15 PM      Component Value Range Comment   Squamous Epithelial / LPF FEW (*) RARE     WBC, UA 0-2  <3 (WBC/hpf)    RBC / HPF 0-2  <3 (RBC/hpf)    Bacteria, UA FEW (*) RARE     Urine-Other MANY YEAST       Radiology Reports: Dg Abd Acute  W/chest  03/20/2012  *RADIOLOGY REPORT*  Clinical Data:  Nausea and vomiting.  Shortness of breath and cough.  ACUTE ABDOMEN SERIES (ABDOMEN 2 VIEW & CHEST 1 VIEW)  Comparison: Portable chest 12/15/2011.  Findings: The heart is enlarged.  There is no edema or effusion to suggest failure.  The lungs are clear.  Supine and upright views of the abdomen demonstrate a nonspecific bowel gas pattern.  There is no evidence for obstruction or free air.  Degenerative changes are noted in the lumbar spine and right greater than left hip and SI joints.  Atherosclerotic calcifications are noted without evidence for aneurysm.  IMPRESSION:  1.  Cardiomegaly without failure. 2.  Nonspecific bowel gas pattern. 3.  Atherosclerosis.  Original Report Authenticated By: Jamesetta Orleans. MATTERN, M.D.    Electrocardiogram: Sinus rhythm with first degree AV block. Left axis deviation. Rate is 66 beats per minute. There is evidence for right bundle branch block. Q wave seen in inferior lead. Compared to a previous EKG  this EKG is similar.  Assessment/Plan  Principal Problem:  *Acute bronchitis Active Problems:  HYPERTENSION, UNSPECIFIED  Dehydration  Malnutrition of moderate degree  Dementia  DM type 2 (diabetes mellitus, type 2)   #1 acute bronchitis: This will be treated with the nebulizer treatments, steroids, and antibiotics. There could be microaspiration, so, we will get a swallow evaluation, as well. Chest pain is probably related to the bronchitis. Cardiac enzymes negative.  #2 dehydration with the elevated urine specific gravity: Will give her gentle IV fluids, and reevaluate tomorrow morning.  #3. Mild gastroenteritis: Stool for C. Difficile will be checked because of recent antibiotic use. We'll monitor her symptoms closely in the hospital.  #4 diabetes, type II: With her on a sliding scale. HbA1c will be checked.  #5 nonobstructive three-vessel coronary artery disease: This appears to be stable. Monitor closely. She has been followed by [facility] cardiology in the past.  #6 dementia: Appears to be quite advanced. She has poor quality of life. She's pretty much bedridden.  #7 malnutrition with failure to thrive: Will give her Ensure once she has passed her swallow evaluation.  #8 CODE STATUS: I discussed this in detail with her power of attorney. At this time she would like for her to be full code. I did go over the fact, that patient has poor quality of life and has advanced dementia. However, daughter would like for her to be resuscitated.  #9 Disposition: Daughter would not like for her to go to any nursing facility at discharge. She tells me that she was in a nursing home recently and had pretty bad fall over there. So she's very skeptical of the care provided at skilled nursing facilities.  DVT, prophylaxis will be initiated.  Further management decisions will depend on results of further testing and patient's response to treatment.  [facility]  [facility] Pager  [phone]  03/20/2012, 10:46 PM

## 2012-03-21 ENCOUNTER — Encounter (HOSPITAL_COMMUNITY): Payer: Self-pay | Admitting: *Deleted

## 2012-03-21 DIAGNOSIS — R627 Adult failure to thrive: Secondary | ICD-10-CM

## 2012-03-21 DIAGNOSIS — F0390 Unspecified dementia without behavioral disturbance: Secondary | ICD-10-CM

## 2012-03-21 DIAGNOSIS — J209 Acute bronchitis, unspecified: Secondary | ICD-10-CM

## 2012-03-21 LAB — CARDIAC PANEL(CRET KIN+CKTOT+MB+TROPI)
CK, MB: 1.8 ng/mL (ref 0.3–4.0)
CK, MB: 1.9 ng/mL (ref 0.3–4.0)
Total CK: 29 U/L (ref 7–177)
Troponin I: <0.3 ng/mL (ref <0.30)
Troponin I: <0.3 ng/mL (ref <0.30)

## 2012-03-21 LAB — HEMOGLOBIN A1C: Mean Plasma Glucose: 137 mg/dL — ABNORMAL HIGH (ref <117)

## 2012-03-21 LAB — COMPREHENSIVE METABOLIC PANEL
AST: 26 U/L (ref 0–37)
BUN: 13 mg/dL (ref 6–23)
CO2: 28 mEq/L (ref 19–32)
Calcium: 9.3 mg/dL (ref 8.4–10.5)
Chloride: 103 mEq/L (ref 96–112)
Creatinine, Ser: 1.19 mg/dL — ABNORMAL HIGH (ref 0.50–1.10)
GFR calc Af Amer: 47 mL/min — ABNORMAL LOW (ref >90)
GFR calc non Af Amer: 40 mL/min — ABNORMAL LOW (ref >90)
Glucose, Bld: 95 mg/dL (ref 70–99)
Total Bilirubin: 0.2 mg/dL — ABNORMAL LOW (ref 0.3–1.2)

## 2012-03-21 LAB — CBC
Hemoglobin: 11 g/dL — ABNORMAL LOW (ref 12.0–15.0)
MCH: 33.5 pg (ref 26.0–34.0)
Platelets: 133 10*3/uL — ABNORMAL LOW (ref 150–400)
RBC: 3.28 MIL/uL — ABNORMAL LOW (ref 3.87–5.11)

## 2012-03-21 LAB — GLUCOSE, CAPILLARY
Glucose-Capillary: 267 mg/dL — ABNORMAL HIGH (ref 70–99)
Glucose-Capillary: 264 mg/dL — ABNORMAL HIGH (ref 70–99)

## 2012-03-21 LAB — PROTIME-INR
INR: 1.15 (ref 0.00–1.49)
Prothrombin Time: 14.9 seconds (ref 11.6–15.2)

## 2012-03-21 MED ORDER — ALPRAZOLAM 0.5 MG PO TABS
0.5000 mg | ORAL_TABLET | Freq: Every evening | ORAL | Status: DC | PRN
  Administered 2012-03-21: 0.5 mg via ORAL
  Filled 2012-03-21: qty 1

## 2012-03-21 MED ORDER — AMLODIPINE BESYLATE 5 MG PO TABS
2.5000 mg | ORAL_TABLET | Freq: Every day | ORAL | Status: DC
  Administered 2012-03-21 – 2012-03-22 (×2): 2.5 mg via ORAL
  Filled 2012-03-21 (×2): qty 1

## 2012-03-21 MED ORDER — ALBUTEROL SULFATE (5 MG/ML) 0.5% IN NEBU
2.5000 mg | INHALATION_SOLUTION | Freq: Three times a day (TID) | RESPIRATORY_TRACT | Status: DC
  Administered 2012-03-21 – 2012-03-22 (×4): 2.5 mg via RESPIRATORY_TRACT
  Filled 2012-03-21 (×4): qty 0.5

## 2012-03-21 MED ORDER — SIMVASTATIN 20 MG PO TABS
20.0000 mg | ORAL_TABLET | Freq: Every day | ORAL | Status: DC
  Administered 2012-03-21: 20 mg via ORAL
  Filled 2012-03-21: qty 1

## 2012-03-21 MED ORDER — METHYLPREDNISOLONE SODIUM SUCC 125 MG IJ SOLR
60.0000 mg | Freq: Three times a day (TID) | INTRAMUSCULAR | Status: DC
  Administered 2012-03-21 (×2): 60 mg via INTRAVENOUS
  Filled 2012-03-21 (×2): qty 2

## 2012-03-21 MED ORDER — HYDROCODONE-ACETAMINOPHEN 5-325 MG PO TABS: 1.0000 | ORAL_TABLET | ORAL | Status: DC | PRN

## 2012-03-21 MED ORDER — SODIUM CHLORIDE 0.9 % IV SOLN
INTRAVENOUS | Status: DC
  Administered 2012-03-21: 01:00:00 via INTRAVENOUS

## 2012-03-21 MED ORDER — LEVOFLOXACIN IN D5W 500 MG/100ML IV SOLN
500.0000 mg | INTRAVENOUS | Status: DC
  Administered 2012-03-21: 500 mg via INTRAVENOUS
  Filled 2012-03-21 (×2): qty 100

## 2012-03-21 MED ORDER — SODIUM CHLORIDE 0.9 % IJ SOLN
INTRAMUSCULAR | Status: AC
  Administered 2012-03-21: 09:00:00
  Filled 2012-03-21: qty 3

## 2012-03-21 MED ORDER — ACETAMINOPHEN 325 MG PO TABS: 650.0000 mg | ORAL_TABLET | Freq: Four times a day (QID) | ORAL | Status: DC | PRN

## 2012-03-21 MED ORDER — ALBUTEROL SULFATE (5 MG/ML) 0.5% IN NEBU: 2.5000 mg | INHALATION_SOLUTION | RESPIRATORY_TRACT | Status: DC | PRN

## 2012-03-21 MED ORDER — LEVOFLOXACIN IN D5W 500 MG/100ML IV SOLN
500.0000 mg | INTRAVENOUS | Status: DC
  Administered 2012-03-22: 500 mg via INTRAVENOUS
  Filled 2012-03-21 (×2): qty 100

## 2012-03-21 MED ORDER — INSULIN ASPART 100 UNIT/ML ~~LOC~~ SOLN
0.0000 [IU] | Freq: Three times a day (TID) | SUBCUTANEOUS | Status: DC
  Administered 2012-03-21: 8 [IU] via SUBCUTANEOUS
  Administered 2012-03-21: 3 [IU] via SUBCUTANEOUS
  Administered 2012-03-21: 8 [IU] via SUBCUTANEOUS
  Administered 2012-03-22: 3 [IU] via SUBCUTANEOUS
  Administered 2012-03-22: 5 [IU] via SUBCUTANEOUS

## 2012-03-21 MED ORDER — ENOXAPARIN SODIUM 40 MG/0.4ML ~~LOC~~ SOLN
40.0000 mg | SUBCUTANEOUS | Status: DC
  Administered 2012-03-21 – 2012-03-22 (×2): 40 mg via SUBCUTANEOUS
  Filled 2012-03-21 (×2): qty 0.4

## 2012-03-21 MED ORDER — ISOSORBIDE MONONITRATE 20 MG PO TABS
20.0000 mg | ORAL_TABLET | Freq: Two times a day (BID) | ORAL | Status: DC
  Administered 2012-03-21 – 2012-03-22 (×4): 20 mg via ORAL
  Filled 2012-03-21 (×4): qty 1

## 2012-03-21 MED ORDER — LEVOFLOXACIN IN D5W 500 MG/100ML IV SOLN
INTRAVENOUS | Status: AC
  Filled 2012-03-21: qty 100

## 2012-03-21 MED ORDER — ACETAMINOPHEN 650 MG RE SUPP: 650.0000 mg | Freq: Four times a day (QID) | RECTAL | Status: DC | PRN

## 2012-03-21 MED ORDER — PANTOPRAZOLE SODIUM 40 MG PO TBEC
40.0000 mg | DELAYED_RELEASE_TABLET | Freq: Every day | ORAL | Status: DC
  Administered 2012-03-21 – 2012-03-22 (×2): 40 mg via ORAL
  Filled 2012-03-21 (×2): qty 1

## 2012-03-21 MED ORDER — LEVOFLOXACIN IN D5W 250 MG/50ML IV SOLN
250.0000 mg | INTRAVENOUS | Status: DC
  Filled 2012-03-21: qty 50

## 2012-03-21 MED ORDER — POTASSIUM CHLORIDE 10 MEQ/100ML IV SOLN
10.0000 meq | INTRAVENOUS | Status: DC
  Administered 2012-03-21: 10 meq via INTRAVENOUS
  Filled 2012-03-21 (×2): qty 100

## 2012-03-21 MED ORDER — POTASSIUM CHLORIDE CRYS ER 10 MEQ PO TBCR
30.0000 meq | EXTENDED_RELEASE_TABLET | Freq: Once | ORAL | Status: AC
  Administered 2012-03-21: 30 meq via ORAL
  Filled 2012-03-21: qty 1

## 2012-03-21 MED ORDER — ONDANSETRON HCL 4 MG/2ML IJ SOLN: 4.0000 mg | Freq: Four times a day (QID) | INTRAMUSCULAR | Status: DC | PRN

## 2012-03-21 MED ORDER — ASPIRIN 81 MG PO CHEW
81.0000 mg | CHEWABLE_TABLET | Freq: Every day | ORAL | Status: DC
  Administered 2012-03-21 – 2012-03-22 (×2): 81 mg via ORAL
  Filled 2012-03-21 (×2): qty 1

## 2012-03-21 MED ORDER — ALBUTEROL SULFATE (5 MG/ML) 0.5% IN NEBU
2.5000 mg | INHALATION_SOLUTION | RESPIRATORY_TRACT | Status: DC
  Administered 2012-03-21: 2.5 mg via RESPIRATORY_TRACT
  Filled 2012-03-21: qty 0.5

## 2012-03-21 MED ORDER — IPRATROPIUM BROMIDE 0.02 % IN SOLN
0.5000 mg | Freq: Three times a day (TID) | RESPIRATORY_TRACT | Status: DC
  Administered 2012-03-21 – 2012-03-22 (×4): 0.5 mg via RESPIRATORY_TRACT
  Filled 2012-03-21 (×4): qty 2.5

## 2012-03-21 MED ORDER — ALLOPURINOL 100 MG PO TABS
100.0000 mg | ORAL_TABLET | Freq: Every day | ORAL | Status: DC
  Administered 2012-03-21 – 2012-03-22 (×2): 100 mg via ORAL
  Filled 2012-03-21 (×2): qty 1

## 2012-03-21 MED ORDER — ALPRAZOLAM 0.25 MG PO TABS
0.2500 mg | ORAL_TABLET | Freq: Two times a day (BID) | ORAL | Status: DC | PRN
  Administered 2012-03-21 (×2): 0.25 mg via ORAL
  Filled 2012-03-21 (×2): qty 1

## 2012-03-21 MED ORDER — ONDANSETRON HCL 4 MG PO TABS: 4.0000 mg | ORAL_TABLET | Freq: Four times a day (QID) | ORAL | Status: DC | PRN

## 2012-03-21 MED ORDER — IPRATROPIUM BROMIDE 0.02 % IN SOLN
0.5000 mg | RESPIRATORY_TRACT | Status: DC
  Administered 2012-03-21: 0.5 mg via RESPIRATORY_TRACT
  Filled 2012-03-21: qty 2.5

## 2012-03-21 NOTE — Progress Notes (Signed)
Chart reviewed. Old records reviewed.  Subjective: "I don't feel good". Will not qualify further. Per nursing staff, pulled out IV. She removes telemetry. No vomiting or diarrhea noted. Did not eat today. No coughing noted today. Apparently coughed last night.  Objective: Vital signs in last 24 hours: Temp:  [98.2 F (36.8 C)-98.6 F (37 C)] 98.2 F (36.8 C) (05/12 0541) Pulse Rate:  [54-68] 68  (05/12 0541) Resp:  [16-23] 18  (05/12 0541) BP: (106-180)/(29-125) 155/75 mmHg (05/12 0541) SpO2:  [90 %-99 %] 94 % (05/12 0801) Weight:  [86.32 kg (190 lb 4.8 oz)-99.791 kg (220 lb)] 86.46 kg (190 lb 9.8 oz) (05/12 0530) Weight change:  Last BM Date: 03/20/12  Intake/Output from previous day: 05/11 0701 - 05/12 0700 In: 1471 [P.O.:480; I.V.:391; IV Piggyback:600] Out: 451 [Urine:451] Intake/Output this shift:   General: Obese, asleep. Arousable. Confused. Calm. No cough noted. HEENT: MMM Lungs clear to auscultation bilaterally without wheeze rhonchi or rales Cardiovascular regular rate rhythm without murmurs gallops rubs Abdomen obese soft nontender Extremities no clubbing cyanosis or edema  Lab Results:  [facility] 03/21/12 0311 03/20/12 1856  WBC 3.6* 4.0  HGB 11.0* 11.6*  HCT 33.2* 35.9*  PLT 133* 141*   BMET  Basename 03/21/12 0311 03/20/12 1856  NA 138 134*  K 3.3* 3.8  CL 103 99  CO2 28 26  GLUCOSE 95 193*  BUN 13 14  CREATININE 1.19* 1.19*  CALCIUM 9.3 9.5   Cardiac Panel (last 3 results)  Basename 03/21/12 0311 03/20/12 1856  CKTOTAL 29 37  CKMB 1.8 1.7  TROPONINI <0.30 <0.30  RELINDX RELATIVE INDEX IS INVALID RELATIVE INDEX IS INVALID    Basename 03/21/12 0739  GLUCAP 167*    Studies/Results: Dg Abd Acute W/chest  03/20/2012  *RADIOLOGY REPORT*  Clinical Data:  Nausea and vomiting.  Shortness of breath and cough.  ACUTE ABDOMEN SERIES (ABDOMEN 2 VIEW & CHEST 1 VIEW)  Comparison: Portable chest 12/15/2011.  Findings: The heart is enlarged.  There is  no edema or effusion to suggest failure.  The lungs are clear.  Supine and upright views of the abdomen demonstrate a nonspecific bowel gas pattern.  There is no evidence for obstruction or free air.  Degenerative changes are noted in the lumbar spine and right greater than left hip and SI joints.  Atherosclerotic calcifications are noted without evidence for aneurysm.  IMPRESSION:  1.  Cardiomegaly without failure. 2.  Nonspecific bowel gas pattern. 3.  Atherosclerosis.  Original Report Authenticated By: Jamesetta Orleans. MATTERN, M.D.   Assessment/Plan: Cough, possibly secondary to acute bronchitis, rule out dysphasia. Will change levofloxacin to 500 mg IV daily. Await space there appear evaluation.  Reported vomiting, none while here. No loose stool here.  Poor oral intake, apparently has been chronic. Patient was started on Remeron previously, but I do not see this listed on her home medications. Could be related to end-stage dementia versus acute illness. Patient was admitted with failure to thrive last year.  End-stage dementia: Patient has been removing wires and tubes. Will discontinue Foley catheter. Nurses are trying to replace an IV. Patient previously was DO NOT RESUSCITATE and her prognosis was felt to be poor. No family members available to discuss appropriate goals of care.  Cardiac enzymes negative.  Diabetes, controlled.  Obesity  Debility/bedridden state.  Hypokalemia: Replete IV if IV can be reestablished.   LOS: 1 day   Asta Corbridge L 03/21/2012, 10:04 AM

## 2012-03-22 DIAGNOSIS — J209 Acute bronchitis, unspecified: Secondary | ICD-10-CM

## 2012-03-22 DIAGNOSIS — F0390 Unspecified dementia without behavioral disturbance: Secondary | ICD-10-CM

## 2012-03-22 MED ORDER — ALBUTEROL SULFATE HFA 108 (90 BASE) MCG/ACT IN AERS: 2.0000 | INHALATION_SPRAY | RESPIRATORY_TRACT | Status: DC | PRN

## 2012-03-22 MED ORDER — ALBUTEROL SULFATE (2.5 MG/3ML) 0.083% IN NEBU: 2.5000 mg | INHALATION_SOLUTION | RESPIRATORY_TRACT | Status: DC | PRN

## 2012-03-22 MED ORDER — SODIUM CHLORIDE 0.9 % IJ SOLN
INTRAMUSCULAR | Status: AC
  Filled 2012-03-22: qty 3

## 2012-03-22 MED ORDER — LEVOFLOXACIN 500 MG PO TABS: 500.0000 mg | ORAL_TABLET | Freq: Every day | ORAL | Status: AC

## 2012-03-22 NOTE — Progress Notes (Signed)
Utilization Review Completed.  

## 2012-03-22 NOTE — Plan of Care (Signed)
Problem: Discharge Progression Outcomes Goal: Other Discharge Outcomes/Goals Outcome: Completed/Met Date Met:  03/22/12 Discharged to home with family Home health to see pt   Discharge instructions read to pt and her daughter Daughter verbalized understanding of all instructions

## 2012-03-22 NOTE — Evaluation (Signed)
Clinical/Bedside Swallow Evaluation Patient Details  Name: CORAYMA CASHATT MRN: [id number] Date of Birth: 1924-04-30  Today's Date: 03/22/2012 Time: 7829-5621 SLP Time Calculation (min): 33 min  Past Medical History:  Past Medical History  Diagnosis Date  . CHF (congestive heart failure)   . COPD (chronic obstructive pulmonary disease)   . Diabetes mellitus   . A-fib   . Lupus   . Alzheimer's dementia   . Aortic disease   . Hypertension   . Gout   . High cholesterol    Past Surgical History:  Past Surgical History  Procedure Date  . Cholecystectomy   . Foot surgery   . Tubal ligation   . Tympanostomy tube placement    HPI:  The patient is an 78 year old white female with advanced dementia who was brought in with poor appetite, cough, reported vomiting and diarrhea. Her daughter reports that she occasionally has food come back up (regurgitate) when eating meats. She has not had esophageal dilation per daughter, but does have a hiatal hernia and takes a PPI.    Assessment / Plan / Recommendation Clinical Impression  Overall oropharyngeal swallow appears WFL with dentures in place. Pt did exhibit some characteristics of esophageal dysphagia with belching after swallows (thins, medications, and regular textures) and family reports that she occasionally regurgitates meats. The patient's daughter and caregiver were advised to implement reflux precautions (sit upright for all po and remain upright 30-60" after, follow with liquid wash, and encourage frequent small meals). The family was advised to notify PCP if symptoms occurr with increased frequency and possibly seek GI consult at that time.    Aspiration Risk  Mild (possibly post prandial)    Diet Recommendation Dysphagia 3 (Mechanical Soft);Thin liquid   Other  Recommendations  Reflux precautions  Follow Up Recommendations  None    Frequency and Duration  N/A            Swallow Study Prior Functional Status         General Date of Onset: 03/20/12 HPI: The patient is an 78 year old white female with advanced dementia who was brought in with poor appetite, cough, reported vomiting and diarrhea. Her daughter reports that she occasionally has food come back up (regurgitate) when eating meats. She has not had esophageal dilation per daughter, but does have a hiatal hernia and takes a PPI.  Type of Study: Bedside swallow evaluation Previous Swallow Assessment: N/A Diet Prior to this Study: Dysphagia 3 (soft);Thin liquids Temperature Spikes Noted: No Respiratory Status: Room air History of Intubation: No Behavior/Cognition: Alert;Cooperative;Hard of hearing;Requires cueing Oral Cavity - Dentition: Dentures, top;Dentures, bottom Vision: Functional for self-feeding Patient Positioning: Upright in bed Baseline Vocal Quality: Clear Volitional Cough: Strong Volitional Swallow: Able to elicit    Oral/Motor/Sensory Function Overall Oral Motor/Sensory Function: Appears within functional limits for tasks assessed   Ice Chips Ice chips: Not tested   Thin Liquid Thin Liquid: Within functional limits Presentation: Straw;Self Fed    Nectar Thick Nectar Thick Liquid: Not tested   Honey Thick Honey Thick Liquid: Not tested   Puree Puree: Within functional limits Presentation: Spoon;Self Fed   Solid Solid: Within functional limits (mechanical soft) Presentation: Self Fed Other Comments: Some belching after po noted.    Sandy Blouch 03/22/2012,12:49 PM

## 2012-03-22 NOTE — Progress Notes (Addendum)
Inpatient Diabetes Program Recommendations  AACE/ADA: New Consensus Statement on Inpatient Glycemic Control  Target Ranges:  Prepandial:   less than 140 mg/dL      Peak postprandial:   less than 180 mg/dL (1-2 hours)      Critically ill patients:  140 - 180 mg/dL  Pager:  [phone] Hours:  8 am-10pm   Reason for Visit: Elevated glucose without home insulin  Results for [name], [name] (MRN [id number]) as of 03/22/2012 08:40  Ref. Range 03/21/2012 07:39 03/21/2012 11:44 03/21/2012 17:02 03/21/2012 20:54 03/22/2012 07:22  Glucose-Capillary Latest Range: 70-99 mg/dL 601 (H) 093 (H) 235 (H) 246 (H) 195 (H)    Inpatient Diabetes Program Recommendations Insulin - Basal: Consider adding home 70/30 or split into basal/ bolus:  NPH 20 units BID and Novolog 6 units TID     Note:  Patient eating 50-75% of meals.

## 2012-03-22 NOTE — Care Management Note (Signed)
    Page 1 of 1   03/22/2012     2:11:34 PM   CARE MANAGEMENT NOTE 03/22/2012  Patient:  [name], [name]   Account Number:  [id number]  Date Initiated:  03/22/2012  Documentation initiated by:  Rosemary Holms  Subjective/Objective Assessment:   Pt admitted from home where she lives with her son and Daughter in Social worker. 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 Previously used [facility].     Action/Plan:   Spoke to her daughter Jenita Seashore. [phone]. Daughter wishes pt to have an RN from [facility] come into home and assess her lungs after DC. She is very worried that she will develop PNA   Anticipated DC Date:  03/22/2012   Anticipated DC Plan:  HOME W HOME HEALTH SERVICES      DC Planning Services  CM consult      Choice offered to / List presented to:          [facility] arranged  HH-10 DISEASE MANAGEMENT  HH-1 RN      [facility] agency  [facility]   Status of service:  Completed, signed off Medicare Important Message given?   (If response is "NO", the following Medicare IM given date fields will be blank) Date Medicare IM given:   Date Additional Medicare IM given:    Discharge Disposition:  HOME W HOME HEALTH SERVICES  Per UR Regulation:    If discussed at Long Length of Stay Meetings, dates discussed:    Comments:  03/22/12 Rosemary Holms RN BSN CM

## 2012-03-22 NOTE — Discharge Summary (Addendum)
Physician Discharge Summary  Patient ID: [name] MRN: [id number] DOB/AGE: 1924-08-11 78 y.o.  Admit date: 03/20/2012 Discharge date: 03/22/2012  Discharge Diagnoses:  Principal Problem:  *Acute bronchitis Active Problems:  HYPERTENSION, UNSPECIFIED  Dehydration  Malnutrition of moderate degree  Dementia  DM type 2 (diabetes mellitus, type 2)   Medication List  As of 03/22/2012 11:29 AM   STOP taking these medications         CALCIUM 600 + D PO         TAKE these medications         albuterol (2.5 MG/3ML) 0.083% nebulizer solution   Commonly known as: PROVENTIL   Take 3 mLs (2.5 mg total) by nebulization every 4 (four) hours as needed for wheezing or shortness of breath. For shortness of breath      albuterol 108 (90 BASE) MCG/ACT inhaler   Commonly known as: PROVENTIL HFA;VENTOLIN HFA   Inhale 2 puffs into the lungs every 4 (four) hours as needed for wheezing or shortness of breath.      allopurinol 100 MG tablet   Commonly known as: ZYLOPRIM   Take 100 mg by mouth every morning.      ALPRAZolam 0.25 MG tablet   Commonly known as: XANAX   Take 0.25 mg by mouth daily as needed. For anxiety. **GIVE ONE TABLET AT BEDTIME. MAY GIVE ONE TABLET DAILY AS NEEDED**      aspirin 81 MG chewable tablet   Chew 81 mg by mouth every morning.      HYDROcodone-acetaminophen 7.5-325 MG per tablet   Commonly known as: NORCO   Take 1 tablet by mouth as needed. Patient takes a 1/2 tablet as needed for pain      isosorbide mononitrate 20 MG tablet   Commonly known as: ISMO,MONOKET   Take 20 mg by mouth 2 (two) times daily.      levofloxacin 500 MG tablet   Commonly known as: LEVAQUIN   Take 1 tablet (500 mg total) by mouth daily.      NORVASC 2.5 MG tablet   Generic drug: amLODipine   Take 2.5 mg by mouth daily.      NOVOLOG MIX 70/30 FLEXPEN (70-30) 100 UNIT/ML injection   Generic drug: insulin aspart protamine-insulin aspart   Inject into the skin as directed. **MORNING  DOSE: IF BLOOD SUGAR IS 150 OR MORE=36 UNITS-IF 150 AND UNDER GIVE 32 UNITS. EVENING DOSE: IF BLOOD SUGAR IS LOWER THAN 150 GIVE 26 UNITS. IF LEVELS ARE 150 OR MORE GIVE 30 UNITS**      omeprazole 20 MG capsule   Commonly known as: PRILOSEC   Take 20 mg by mouth every morning.      simvastatin 20 MG tablet   Commonly known as: ZOCOR   Take 20 mg by mouth 2 (two) times daily.      vitamin B-12 1000 MCG tablet   Commonly known as: CYANOCOBALAMIN   Take 1,000 mcg by mouth every morning.            Discharge Orders    Future Orders Please Complete By Expires   Diet Carb Modified         Follow-up Information    Follow up with VYAS,DHRUV B., MD. (As needed if symptoms worsen)    Contact information:   [address] [place] [address] [phone]         will need repeat TSH drawn as an outpatient, once symptoms have resolved.  Disposition: 01-Home  or Self Care with home health nursing  Discharged Condition: stable  Consults:   speech therapy consult pending.  Labs:   Results for orders placed during the hospital encounter of 03/20/12 (from the past 48 hour(s))  CBC     Status: Abnormal   Collection Time   03/20/12  6:56 PM      Component Value Range Comment   WBC 4.0  4.0 - 10.5 (K/uL)    RBC 3.54 (*) 3.87 - 5.11 (MIL/uL)    Hemoglobin 11.6 (*) 12.0 - 15.0 (g/dL)    HCT 16.1 (*) 09.6 - 46.0 (%)    MCV 101.4 (*) 78.0 - 100.0 (fL)    MCH 32.8  26.0 - 34.0 (pg)    MCHC 32.3  30.0 - 36.0 (g/dL)    RDW 04.5  40.9 - 81.1 (%)    Platelets 141 (*) 150 - 400 (K/uL)   DIFFERENTIAL     Status: Abnormal   Collection Time   03/20/12  6:56 PM      Component Value Range Comment   Neutrophils Relative 40 (*) 43 - 77 (%)    Neutro Abs 1.6 (*) 1.7 - 7.7 (K/uL)    Lymphocytes Relative 33  12 - 46 (%)    Lymphs Abs 1.3  0.7 - 4.0 (K/uL)    Monocytes Relative 10  3 - 12 (%)    Monocytes Absolute 0.4  0.1 - 1.0 (K/uL)    Eosinophils Relative 17 (*) 0 - 5 (%)     Eosinophils Absolute 0.7  0.0 - 0.7 (K/uL)    Basophils Relative 0  0 - 1 (%)    Basophils Absolute 0.0  0.0 - 0.1 (K/uL)   BASIC METABOLIC PANEL     Status: Abnormal   Collection Time   03/20/12  6:56 PM      Component Value Range Comment   Sodium 134 (*) 135 - 145 (mEq/L)    Potassium 3.8  3.5 - 5.1 (mEq/L)    Chloride 99  96 - 112 (mEq/L)    CO2 26  19 - 32 (mEq/L)    Glucose, Bld 193 (*) 70 - 99 (mg/dL)    BUN 14  6 - 23 (mg/dL)    Creatinine, Ser 9.14 (*) 0.50 - 1.10 (mg/dL)    Calcium 9.5  8.4 - 10.5 (mg/dL)    GFR calc non Af Amer 40 (*) >90 (mL/min)    GFR calc Af Amer 47 (*) >90 (mL/min)   CARDIAC PANEL(CRET KIN+CKTOT+MB+TROPI)     Status: Normal   Collection Time   03/20/12  6:56 PM      Component Value Range Comment   Total CK 37  7 - 177 (U/L)    CK, MB 1.7  0.3 - 4.0 (ng/mL)    Troponin I <0.30  <0.30 (ng/mL)    Relative Index RELATIVE INDEX IS INVALID  0.0 - 2.5    HEPATIC FUNCTION PANEL     Status: Abnormal   Collection Time   03/20/12  6:56 PM      Component Value Range Comment   Total Protein 6.8  6.0 - 8.3 (g/dL)    Albumin 2.6 (*) 3.5 - 5.2 (g/dL)    AST 33  0 - 37 (U/L) SLIGHT HEMOLYSIS   ALT 12  0 - 35 (U/L)    Alkaline Phosphatase 87  39 - 117 (U/L)    Total Bilirubin 0.2 (*) 0.3 - 1.2 (mg/dL)    Bilirubin, Direct <7.8  0.0 - 0.3 (mg/dL)    Indirect Bilirubin NOT CALCULATED  0.3 - 0.9 (mg/dL)   LIPASE, BLOOD     Status: Normal   Collection Time   03/20/12  6:56 PM      Component Value Range Comment   Lipase 37  11 - 59 (U/L)   URINALYSIS, ROUTINE W REFLEX MICROSCOPIC     Status: Abnormal   Collection Time   03/20/12  9:15 PM      Component Value Range Comment   Color, Urine YELLOW  YELLOW     APPearance CLEAR  CLEAR     Specific Gravity, Urine >1.030 (*) 1.005 - 1.030     pH 6.0  5.0 - 8.0     Glucose, UA 100 (*) NEGATIVE (mg/dL)    Hgb urine dipstick TRACE (*) NEGATIVE     Bilirubin Urine NEGATIVE  NEGATIVE     Ketones, ur NEGATIVE  NEGATIVE  (mg/dL)    Protein, ur 295 (*) NEGATIVE (mg/dL)    Urobilinogen, UA 0.2  0.0 - 1.0 (mg/dL)    Nitrite NEGATIVE  NEGATIVE     Leukocytes, UA NEGATIVE  NEGATIVE    URINE MICROSCOPIC-ADD ON     Status: Abnormal   Collection Time   03/20/12  9:15 PM      Component Value Range Comment   Squamous Epithelial / LPF FEW (*) RARE     WBC, UA 0-2  <3 (WBC/hpf)    RBC / HPF 0-2  <3 (RBC/hpf)    Bacteria, UA FEW (*) RARE     Urine-Other MANY YEAST     TSH     Status: Abnormal   Collection Time   03/21/12  3:11 AM      Component Value Range Comment   TSH 5.848 (*) 0.350 - 4.500 (uIU/mL)   CARDIAC PANEL(CRET KIN+CKTOT+MB+TROPI)     Status: Normal   Collection Time   03/21/12  3:11 AM      Component Value Range Comment   Total CK 29  7 - 177 (U/L)    CK, MB 1.8  0.3 - 4.0 (ng/mL)    Troponin I <0.30  <0.30 (ng/mL)    Relative Index RELATIVE INDEX IS INVALID  0.0 - 2.5    COMPREHENSIVE METABOLIC PANEL     Status: Abnormal   Collection Time   03/21/12  3:11 AM      Component Value Range Comment   Sodium 138  135 - 145 (mEq/L)    Potassium 3.3 (*) 3.5 - 5.1 (mEq/L)    Chloride 103  96 - 112 (mEq/L)    CO2 28  19 - 32 (mEq/L)    Glucose, Bld 95  70 - 99 (mg/dL)    BUN 13  6 - 23 (mg/dL)    Creatinine, Ser 2.84 (*) 0.50 - 1.10 (mg/dL)    Calcium 9.3  8.4 - 10.5 (mg/dL)    Total Protein 6.3  6.0 - 8.3 (g/dL)    Albumin 2.5 (*) 3.5 - 5.2 (g/dL)    AST 26  0 - 37 (U/L)    ALT 11  0 - 35 (U/L)    Alkaline Phosphatase 81  39 - 117 (U/L)    Total Bilirubin 0.2 (*) 0.3 - 1.2 (mg/dL)    GFR calc non Af Amer 40 (*) >90 (mL/min)    GFR calc Af Amer 47 (*) >90 (mL/min)   CBC     Status: Abnormal   Collection Time   03/21/12  3:11 AM      Component Value Range Comment   WBC 3.6 (*) 4.0 - 10.5 (K/uL)    RBC 3.28 (*) 3.87 - 5.11 (MIL/uL)    Hemoglobin 11.0 (*) 12.0 - 15.0 (g/dL)    HCT 66.4 (*) 40.3 - 46.0 (%)    MCV 101.2 (*) 78.0 - 100.0 (fL)    MCH 33.5  26.0 - 34.0 (pg)    MCHC 33.1  30.0 - 36.0  (g/dL)    RDW 47.4  25.9 - 56.3 (%)    Platelets 133 (*) 150 - 400 (K/uL)   PROTIME-INR     Status: Normal   Collection Time   03/21/12  3:11 AM      Component Value Range Comment   Prothrombin Time 14.9  11.6 - 15.2 (seconds)    INR 1.15  0.00 - 1.49    HEMOGLOBIN A1C     Status: Abnormal   Collection Time   03/21/12  3:11 AM      Component Value Range Comment   Hemoglobin A1C 6.4 (*) <5.7 (%)    Mean Plasma Glucose 137 (*) <117 (mg/dL)   GLUCOSE, CAPILLARY     Status: Abnormal   Collection Time   03/21/12  7:39 AM      Component Value Range Comment   Glucose-Capillary 167 (*) 70 - 99 (mg/dL)    Comment 1 Notify RN      Comment 2 Documented in Chart     CARDIAC PANEL(CRET KIN+CKTOT+MB+TROPI)     Status: Normal   Collection Time   03/21/12  9:32 AM      Component Value Range Comment   Total CK 29  7 - 177 (U/L)    CK, MB 1.9  0.3 - 4.0 (ng/mL)    Troponin I <0.30  <0.30 (ng/mL)    Relative Index RELATIVE INDEX IS INVALID  0.0 - 2.5    GLUCOSE, CAPILLARY     Status: Abnormal   Collection Time   03/21/12 11:44 AM      Component Value Range Comment   Glucose-Capillary 267 (*) 70 - 99 (mg/dL)    Comment 1 Notify RN      Comment 2 Documented in Chart     GLUCOSE, CAPILLARY     Status: Abnormal   Collection Time   03/21/12  5:02 PM      Component Value Range Comment   Glucose-Capillary 264 (*) 70 - 99 (mg/dL)    Comment 1 Notify RN      Comment 2 Documented in Chart     GLUCOSE, CAPILLARY     Status: Abnormal   Collection Time   03/21/12  8:54 PM      Component Value Range Comment   Glucose-Capillary 246 (*) 70 - 99 (mg/dL)   GLUCOSE, CAPILLARY     Status: Abnormal   Collection Time   03/22/12  7:22 AM      Component Value Range Comment   Glucose-Capillary 195 (*) 70 - 99 (mg/dL)   GLUCOSE, CAPILLARY     Status: Abnormal   Collection Time   03/22/12 11:07 AM      Component Value Range Comment   Glucose-Capillary 218 (*) 70 - 99 (mg/dL)     Diagnostics:  Dg Abd Acute  W/chest  03/20/2012  *RADIOLOGY REPORT*  Clinical Data:  Nausea and vomiting.  Shortness of breath and cough.  ACUTE ABDOMEN SERIES (ABDOMEN 2 VIEW & CHEST 1 VIEW)  Comparison: Portable chest 12/15/2011.  Findings:  The heart is enlarged.  There is no edema or effusion to suggest failure.  The lungs are clear.  Supine and upright views of the abdomen demonstrate a nonspecific bowel gas pattern.  There is no evidence for obstruction or free air.  Degenerative changes are noted in the lumbar spine and right greater than left hip and SI joints.  Atherosclerotic calcifications are noted without evidence for aneurysm.  IMPRESSION:  1.  Cardiomegaly without failure. 2.  Nonspecific bowel gas pattern. 3.  Atherosclerosis.  Original Report Authenticated By: Jamesetta Orleans. MATTERN, M.D.   Dg Foot Complete Left  02/26/2012  *RADIOLOGY REPORT*  Clinical Data:  Left foot bruising post fall, unable to bear weight  LEFT FOOT - COMPLETE 3+ VIEW  Comparison: None  Findings: Severe osseous demineralization. Joint spaces preserved. Scattered small vessel vascular calcifications. Questionable cortical destruction and lucency at the medial cuneiform, potentially artifact but subtle fracture not excluded. No additional fracture, dislocation, or bone destruction.  IMPRESSION: Severe osseous demineralization. Questionable nondisplaced fracture versus artifact at medial cuneiform; recommend clinical correlation for pain at this site.  Original Report Authenticated By: Lollie Marrow, M.D.    Procedures: none  EKG: Sinus rhythm with 1st degree A-V block Left axis deviation Right bundle branch block Possible Lateral infarct (cited on or before 10-Oct-2011) Inferior infarct (cited on or before 22-May-2011)  Full Code   Hospital Course: See H&P for complete admission details. The patient is an 78 year old white female with advanced dementia who was brought in with poor appetite, cough, reported vomiting and diarrhea. The patient  is unable to give any history. All history is per family members. Patient had recently been on unknown antibiotics for urinary tract infection. Family wondered whether her symptoms were related to the antibiotics. Patient had no vomiting or diarrhea while here. Chest x-ray showed no infiltrate. Patient was started on levofloxacin IV for possible bronchitis. Speech therapy consult was ordered and is pending. After patient is seen by speech therapy for swallow evaluation, she may be discharged home. Any change in diet texture will be noted in an addendum if needed. Patient has tolerated 100% of her meals today without any difficulty according to nursing, so anticipate her diet will remain unchanged. I prescribed a few more days of levofloxacin. Family has requested a refill on her albuterol nebulizer and inhaler which I have prescribed. The patient is medically stable for discharge and family is anxious to take her home. They have requested a home health nurse which I have arranged. Total time on the day of discharge greater than 30 minutes.   TSH is slightly elevated, but in the setting of acute illness is difficult to interpret. Will need repeat thyroid function tests in a few months once acute illness resolves.  Discharge Exam:  Blood pressure 147/53, pulse 65, temperature 97.4 F (36.3 C), temperature source Oral, resp. rate 20, height 5\' 1"  (1.549 m), weight 86.46 kg (190 lb 9.8 oz), SpO2 98.00%.  General: Comfortable. On commode. No respiratory distress. No cough noted Lungs clear to auscultation bilaterally without wheeze rhonchi or rales Cardiovascular regular rate rhythm without murmurs gallops rubs Abdomen obese soft nontender Extremities no clubbing cyanosis or edema   Signed: Leasia Swann L 03/22/2012, 11:29 AM

## 2012-04-01 ENCOUNTER — Emergency Department (HOSPITAL_COMMUNITY)
Admission: EM | Admit: 2012-04-01 | Discharge: 2012-04-01 | Disposition: A | Discharge status: Home / Self Care | Payer: Medicare Other | Attending: Emergency Medicine | Admitting: Emergency Medicine

## 2012-04-01 ENCOUNTER — Emergency Department (HOSPITAL_COMMUNITY): Payer: Medicare Other

## 2012-04-01 ENCOUNTER — Encounter (HOSPITAL_COMMUNITY): Payer: Self-pay | Admitting: *Deleted

## 2012-04-01 DIAGNOSIS — Z79899 Other long term (current) drug therapy: Secondary | ICD-10-CM | POA: Insufficient documentation

## 2012-04-01 DIAGNOSIS — Z794 Long term (current) use of insulin: Secondary | ICD-10-CM | POA: Insufficient documentation

## 2012-04-01 DIAGNOSIS — I10 Essential (primary) hypertension: Secondary | ICD-10-CM | POA: Insufficient documentation

## 2012-04-01 DIAGNOSIS — W19XXXA Unspecified fall, initial encounter: Secondary | ICD-10-CM | POA: Insufficient documentation

## 2012-04-01 DIAGNOSIS — R5383 Other fatigue: Secondary | ICD-10-CM | POA: Insufficient documentation

## 2012-04-01 DIAGNOSIS — R079 Chest pain, unspecified: Secondary | ICD-10-CM | POA: Insufficient documentation

## 2012-04-01 DIAGNOSIS — R112 Nausea with vomiting, unspecified: Secondary | ICD-10-CM | POA: Insufficient documentation

## 2012-04-01 DIAGNOSIS — G309 Alzheimer's disease, unspecified: Secondary | ICD-10-CM | POA: Insufficient documentation

## 2012-04-01 DIAGNOSIS — R197 Diarrhea, unspecified: Secondary | ICD-10-CM | POA: Insufficient documentation

## 2012-04-01 DIAGNOSIS — R0602 Shortness of breath: Secondary | ICD-10-CM | POA: Insufficient documentation

## 2012-04-01 DIAGNOSIS — F0280 Dementia in other diseases classified elsewhere without behavioral disturbance: Secondary | ICD-10-CM | POA: Insufficient documentation

## 2012-04-01 DIAGNOSIS — S0990XA Unspecified injury of head, initial encounter: Secondary | ICD-10-CM | POA: Insufficient documentation

## 2012-04-01 DIAGNOSIS — E119 Type 2 diabetes mellitus without complications: Secondary | ICD-10-CM | POA: Insufficient documentation

## 2012-04-01 DIAGNOSIS — R5381 Other malaise: Secondary | ICD-10-CM | POA: Insufficient documentation

## 2012-04-01 DIAGNOSIS — N390 Urinary tract infection, site not specified: Secondary | ICD-10-CM

## 2012-04-01 LAB — URINALYSIS, ROUTINE W REFLEX MICROSCOPIC
Leukocytes, UA: NEGATIVE
Nitrite: POSITIVE — AB
Specific Gravity, Urine: >1.03 — ABNORMAL HIGH (ref 1.005–1.030)
pH: 6 (ref 5.0–8.0)

## 2012-04-01 LAB — COMPREHENSIVE METABOLIC PANEL
BUN: 18 mg/dL (ref 6–23)
Calcium: 9.6 mg/dL (ref 8.4–10.5)
GFR calc Af Amer: 41 mL/min — ABNORMAL LOW (ref >90)
Glucose, Bld: 254 mg/dL — ABNORMAL HIGH (ref 70–99)
Sodium: 138 mEq/L (ref 135–145)
Total Protein: 6.4 g/dL (ref 6.0–8.3)

## 2012-04-01 LAB — CBC
MCH: 33.1 pg (ref 26.0–34.0)
MCHC: 33 g/dL (ref 30.0–36.0)
MCV: 100.6 fL — ABNORMAL HIGH (ref 78.0–100.0)
Platelets: 135 10*3/uL — ABNORMAL LOW (ref 150–400)
RBC: 3.62 MIL/uL — ABNORMAL LOW (ref 3.87–5.11)

## 2012-04-01 LAB — DIFFERENTIAL
Basophils Relative: 0 % (ref 0–1)
Eosinophils Absolute: 0.7 10*3/uL (ref 0.0–0.7)
Eosinophils Relative: 16 % — ABNORMAL HIGH (ref 0–5)
Lymphs Abs: 1.3 10*3/uL (ref 0.7–4.0)

## 2012-04-01 LAB — CARDIAC PANEL(CRET KIN+CKTOT+MB+TROPI)
CK, MB: 2 ng/mL (ref 0.3–4.0)
Total CK: 25 U/L (ref 7–177)
Troponin I: <0.3 ng/mL (ref <0.30)

## 2012-04-01 MED ORDER — CEPHALEXIN 500 MG PO CAPS: 500.0000 mg | ORAL_CAPSULE | Freq: Three times a day (TID) | ORAL | Status: AC

## 2012-04-01 MED ORDER — DEXTROSE 5 % IV SOLN
1.0000 g | INTRAVENOUS | Status: DC
  Administered 2012-04-01: 1 g via INTRAVENOUS
  Filled 2012-04-01: qty 10

## 2012-04-01 NOTE — ED Notes (Signed)
Pt denies CP. States feeling "a little SOB" at this time. NAD

## 2012-04-01 NOTE — Discharge Instructions (Signed)
Urinary Tract Infection Infections of the urinary tract can start in several places. A bladder infection (cystitis), a kidney infection (pyelonephritis), and a prostate infection (prostatitis) are different types of urinary tract infections (UTIs). They usually get better if treated with medicines (antibiotics) that kill germs. Take all the medicine until it is gone. You or your child may feel better in a few days, but TAKE ALL MEDICINE or the infection may not respond and may become more difficult to treat. HOME CARE INSTRUCTIONS   Drink enough water and fluids to keep the urine clear or pale yellow. Cranberry juice is especially recommended, in addition to large amounts of water.   Avoid caffeine, tea, and carbonated beverages. They tend to irritate the bladder.   Alcohol may irritate the prostate.   Only take over-the-counter or prescription medicines for pain, discomfort, or fever as directed by your caregiver.  To prevent further infections:  Empty the bladder often. Avoid holding urine for long periods of time.   After a bowel movement, women should cleanse from front to back. Use each tissue only once.   Empty the bladder before and after sexual intercourse.  FINDING OUT THE RESULTS OF YOUR TEST Not all test results are available during your visit. If your or your child's test results are not back during the visit, make an appointment with your caregiver to find out the results. Do not assume everything is normal if you have not heard from your caregiver or the medical facility. It is important for you to follow up on all test results. SEEK MEDICAL CARE IF:   There is back pain.   Your baby is older than 3 months with a rectal temperature of 100.5 F (38.1 C) or higher for more than 1 day.   Your or your child's problems (symptoms) are no better in 3 days. Return sooner if you or your child is getting worse.  SEEK IMMEDIATE MEDICAL CARE IF:   There is severe back pain or lower  abdominal pain.   You or your child develops chills.   You have a fever.   Your baby is older than 3 months with a rectal temperature of 102 F (38.9 C) or higher.   Your baby is 50 months old or younger with a rectal temperature of 100.4 F (38 C) or higher.   There is nausea or vomiting.   There is continued burning or discomfort with urination.  MAKE SURE YOU:   Understand these instructions.   Will watch your condition.   Will get help right away if you are not doing well or get worse.  Document Released: 08/06/2005 Document Revised: 10/16/2011 Document Reviewed: 03/11/2007 [facility] Patient Information 2012 [place], [place].  Cephalexin tablets or capsules What is this medicine? CEPHALEXIN (sef a LEX in) is a cephalosporin antibiotic. It is used to treat certain kinds of bacterial infections It will not work for colds, flu, or other viral infections. This medicine may be used for other purposes; ask your health care provider or pharmacist if you have questions. What should I tell my health care provider before I take this medicine? They need to know if you have any of these conditions: -kidney disease -stomach or intestine problems, especially colitis -an unusual or allergic reaction to cephalexin, other cephalosporins, penicillins, other antibiotics, medicines, foods, dyes or preservatives -pregnant or trying to get pregnant -breast-feeding How should I use this medicine? Take this medicine by mouth with a full glass of water. Follow the directions on the  prescription label. This medicine can be taken with or without food. Take your medicine at regular intervals. Do not take your medicine more often than directed. Take all of your medicine as directed even if you think you are better. Do not skip doses or stop your medicine early. Talk to your pediatrician regarding the use of this medicine in children. While this drug may be prescribed for selected conditions, precautions do  apply. Overdosage: If you think you have taken too much of this medicine contact a poison control center or emergency room at once. NOTE: This medicine is only for you. Do not share this medicine with others. What if I miss a dose? If you miss a dose, take it as soon as you can. If it is almost time for your next dose, take only that dose. Do not take double or extra doses. There should be at least 4 to 6 hours between doses. What may interact with this medicine? -probenecid -some other antibiotics This list may not describe all possible interactions. Give your health care provider a list of all the medicines, herbs, non-prescription drugs, or dietary supplements you use. Also tell them if you smoke, drink alcohol, or use illegal drugs. Some items may interact with your medicine. What should I watch for while using this medicine? Tell your doctor or health care professional if your symptoms do not begin to improve in a few days. Do not treat diarrhea with over the counter products. Contact your doctor if you have diarrhea that lasts more than 2 days or if it is severe and watery. If you have diabetes, you may get a false-positive result for sugar in your urine. Check with your doctor or health care professional. What side effects may I notice from receiving this medicine? Side effects that you should report to your doctor or health care professional as soon as possible: -allergic reactions like skin rash, itching or hives, swelling of the face, lips, or tongue -breathing problems -pain or trouble passing urine -redness, blistering, peeling or loosening of the skin, including inside the mouth -severe or watery diarrhea -unusually weak or tired -yellowing of the eyes, skin Side effects that usually do not require medical attention (report to your doctor or health care professional if they continue or are bothersome): -gas or heartburn -genital or anal irritation -headache -joint or muscle  pain -nausea, vomiting This list may not describe all possible side effects. Call your doctor for medical advice about side effects. You may report side effects to FDA at 1-800-FDA-1088. Where should I keep my medicine? Keep out of the reach of children. Store at room temperature between 59 and 86 degrees F (15 and 30 degrees C). Throw away any unused medicine after the expiration date. NOTE: This sheet is a summary. It may not cover all possible information. If you have questions about this medicine, talk to your doctor, pharmacist, or health care provider.  2012, [employer]. (01/31/2008 5:09:13 PM)

## 2012-04-01 NOTE — ED Provider Notes (Signed)
History   This chart was scribed for Dione Booze, MD by Clarita Crane. The patient was seen in room APA18/APA18. Patient's care was started at 1319.    CSN: [id number]  Arrival date & time 04/01/12  1319   First MD Initiated Contact with Patient 04/01/12 1341      Chief Complaint  Patient presents with  . WEakness    (Consider location/radiation/quality/duration/timing/severity/associated sxs/prior treatment) HPI [name] is a 78 y.o. female who presents to the Emergency Department complaining of constant moderate to severe generalized weakness onset 3 days ago and persistent since with associated decreased appetite, back pain, vomiting and diarrhea which occurred yesterday but resolved today, and SOB that began today. Patient also reports experiencing chest pain yesterday but states she has not experienced any episodes of chest pain today. Denies fever, chills, abdominal pain, cough. Patient with h/o CHF, COPD, diabetes, a-fib, Lups, Alzheimer's dementia, aortic disease, HTN, high cholesterol, cholecystectomy, tubal ligation.   PCP- Sherril Croon  Past Medical History  Diagnosis Date  . CHF (congestive heart failure)   . COPD (chronic obstructive pulmonary disease)   . Diabetes mellitus   . A-fib   . Lupus   . Alzheimer's dementia   . Aortic disease   . Hypertension   . Gout   . High cholesterol     Past Surgical History  Procedure Date  . Cholecystectomy   . Foot surgery   . Tubal ligation   . Tympanostomy tube placement     Family History  Problem Relation Age of Onset  . Diabetes Sister   . Diabetes Brother     History  Substance Use Topics  . Smoking status: Never Smoker   . Smokeless tobacco: Never Used  . Alcohol Use: No    OB History    Grav Para Term Preterm Abortions TAB SAB Ect Mult Living   13    2  2   9       Review of Systems  Constitutional: Positive for appetite change. Negative for fever.  HENT: Negative for rhinorrhea.   Eyes: Negative for  pain.  Respiratory: Positive for shortness of breath. Negative for cough.   Cardiovascular: Positive for chest pain.  Gastrointestinal: Positive for vomiting and diarrhea. Negative for abdominal pain.  Genitourinary: Negative for dysuria.  Musculoskeletal: Negative for back pain.  Skin: Negative for rash.  Neurological: Positive for weakness. Negative for headaches.    Allergies  Haloperidol lactate; Adhesive; and Lorazepam  Home Medications   Current Outpatient Rx  Name Route Sig Dispense Refill  . ALBUTEROL SULFATE HFA 108 (90 BASE) MCG/ACT IN AERS Inhalation Inhale 2 puffs into the lungs every 4 (four) hours as needed for wheezing or shortness of breath. 1 Inhaler 2  . ALBUTEROL SULFATE (2.5 MG/3ML) 0.083% IN NEBU Nebulization Take 3 mLs (2.5 mg total) by nebulization every 4 (four) hours as needed for wheezing or shortness of breath. For shortness of breath 75 mL 0  . ALLOPURINOL 100 MG PO TABS Oral Take 100 mg by mouth every morning.     Marland Kitchen ALPRAZOLAM 0.25 MG PO TABS Oral Take 0.25 mg by mouth daily as needed. For anxiety. **GIVE ONE TABLET AT BEDTIME. MAY GIVE ONE TABLET DAILY AS NEEDED**    . AMLODIPINE BESYLATE 2.5 MG PO TABS Oral Take 2.5 mg by mouth daily.     . ASPIRIN 81 MG PO CHEW Oral Chew 81 mg by mouth every morning.    Marland Kitchen HYDROCODONE-ACETAMINOPHEN 7.5-325 MG PO TABS Oral  Take 1 tablet by mouth as needed. Patient takes a 1/2 tablet as needed for pain    . INSULIN ASPART PROT & ASPART (70-30) 100 UNIT/ML [place] SUSP Subcutaneous Inject into the skin as directed. **MORNING DOSE: IF BLOOD SUGAR IS 150 OR MORE=36 UNITS-IF 150 AND UNDER GIVE 32 UNITS. EVENING DOSE: IF BLOOD SUGAR IS LOWER THAN 150 GIVE 26 UNITS. IF LEVELS ARE 150 OR MORE GIVE 30 UNITS**    . ISOSORBIDE MONONITRATE 20 MG PO TABS Oral Take 20 mg by mouth 2 (two) times daily.     Marland Kitchen LEVOFLOXACIN 500 MG PO TABS Oral Take 1 tablet (500 mg total) by mouth daily. 4 tablet 0  . OMEPRAZOLE 20 MG PO CPDR Oral Take 20 mg by mouth  every morning.     Marland Kitchen SIMVASTATIN 20 MG PO TABS Oral Take 20 mg by mouth 2 (two) times daily.     Marland Kitchen VITAMIN B-12 1000 MCG PO TABS Oral Take 1,000 mcg by mouth every morning.       BP 135/54  Pulse 70  Temp(Src) 98 F (36.7 C) (Oral)  Resp 18  Ht 5\' 1"  (1.549 m)  SpO2 96%  Physical Exam  Nursing note and vitals reviewed. Constitutional: She appears well-developed and well-nourished. No distress.  HENT:  Head: Normocephalic and atraumatic.  Eyes: EOM are normal. Pupils are equal, round, and reactive to light.  Neck: Neck supple. No tracheal deviation present.  Cardiovascular: Normal rate and regular rhythm.  Exam reveals no gallop and no friction rub.   No murmur heard. Pulmonary/Chest: Effort normal. No respiratory distress. She has no wheezes. She has rales (thick, at left base).  Abdominal: Soft. She exhibits no distension.  Musculoskeletal: Normal range of motion. She exhibits no edema.  Neurological: She is alert. No sensory deficit.       Oriented to person and place but not time.  Skin: Skin is warm and dry.  Psychiatric: She has a normal mood and affect. Her behavior is normal.    ED Course  Procedures (including critical care time)  DIAGNOSTIC STUDIES: Oxygen Saturation is 96% on room air, adequate by my interpretation.    COORDINATION OF CARE: 1:50PM- Patient and patient's family member informed of current plan for treatment and evaluation and agrees with plan at this time.  Past medical records reviewed at bedside. Patient was d/c home after hospital admission 10 days ago. Patient was also evaluated in ED 3 months ago for similar complaint.  2:45PM- Patient and family member informed of current lab and imaging results. Patient's family member states that she believes patient's generalized weakness is related to severe fall patient sustained several months ago. Will order CT scan for evaluation. Additional history obtained from patient's family member. Patient's family  member states patient fell at the beginning of April and had significant bruising to head and face but was not evaluated by a physician following this. Family member states patient has not been themselves since the fall occurred.   Results for orders placed during the hospital encounter of 04/01/12  CBC      Component Value Range   WBC 4.7  4.0 - 10.5 (K/uL)   RBC 3.62 (*) 3.87 - 5.11 (MIL/uL)   Hemoglobin 12.0  12.0 - 15.0 (g/dL)   HCT 40.9  81.1 - 91.4 (%)   MCV 100.6 (*) 78.0 - 100.0 (fL)   MCH 33.1  26.0 - 34.0 (pg)   MCHC 33.0  30.0 - 36.0 (g/dL)   RDW  13.5  11.5 - 15.5 (%)   Platelets 135 (*) 150 - 400 (K/uL)  DIFFERENTIAL      Component Value Range   Neutrophils Relative 47  43 - 77 (%)   Neutro Abs 2.2  1.7 - 7.7 (K/uL)   Lymphocytes Relative 27  12 - 46 (%)   Lymphs Abs 1.3  0.7 - 4.0 (K/uL)   Monocytes Relative 10  3 - 12 (%)   Monocytes Absolute 0.5  0.1 - 1.0 (K/uL)   Eosinophils Relative 16 (*) 0 - 5 (%)   Eosinophils Absolute 0.7  0.0 - 0.7 (K/uL)   Basophils Relative 0  0 - 1 (%)   Basophils Absolute 0.0  0.0 - 0.1 (K/uL)  COMPREHENSIVE METABOLIC PANEL      Component Value Range   Sodium 138  135 - 145 (mEq/L)   Potassium 3.7  3.5 - 5.1 (mEq/L)   Chloride 102  96 - 112 (mEq/L)   CO2 28  19 - 32 (mEq/L)   Glucose, Bld 254 (*) 70 - 99 (mg/dL)   BUN 18  6 - 23 (mg/dL)   Creatinine, Ser 4.09 (*) 0.50 - 1.10 (mg/dL)   Calcium 9.6  8.4 - 81.1 (mg/dL)   Total Protein 6.4  6.0 - 8.3 (g/dL)   Albumin 2.5 (*) 3.5 - 5.2 (g/dL)   AST 23  0 - 37 (U/L)   ALT 13  0 - 35 (U/L)   Alkaline Phosphatase 81  39 - 117 (U/L)   Total Bilirubin 0.2 (*) 0.3 - 1.2 (mg/dL)   GFR calc non Af Amer 36 (*) >90 (mL/min)   GFR calc Af Amer 41 (*) >90 (mL/min)  URINALYSIS, ROUTINE W REFLEX MICROSCOPIC      Component Value Range   Color, Urine YELLOW  YELLOW    APPearance CLEAR  CLEAR    Specific Gravity, Urine >1.030 (*) 1.005 - 1.030    pH 6.0  5.0 - 8.0    Glucose, UA 250 (*) NEGATIVE  (mg/dL)   Hgb urine dipstick NEGATIVE  NEGATIVE    Bilirubin Urine NEGATIVE  NEGATIVE    Ketones, ur NEGATIVE  NEGATIVE (mg/dL)   Protein, ur >914 (*) NEGATIVE (mg/dL)   Urobilinogen, UA 0.2  0.0 - 1.0 (mg/dL)   Nitrite POSITIVE (*) NEGATIVE    Leukocytes, UA NEGATIVE  NEGATIVE   CARDIAC PANEL(CRET KIN+CKTOT+MB+TROPI)      Component Value Range   Total CK 25  7 - 177 (U/L)   CK, MB 2.0  0.3 - 4.0 (ng/mL)   Troponin I <0.30  <0.30 (ng/mL)   Relative Index RELATIVE INDEX IS INVALID  0.0 - 2.5   URINE MICROSCOPIC-ADD ON      Component Value Range   Squamous Epithelial / LPF FEW (*) RARE    WBC, UA 11-20  <3 (WBC/hpf)   RBC / HPF 0-2  <3 (RBC/hpf)   Bacteria, UA MANY (*) RARE    Casts HYALINE CASTS (*) NEGATIVE    Ct Head Wo Contrast  04/01/2012  *RADIOLOGY REPORT*  Clinical Data: Weakness.  History of Alzheimer's disease.  CT HEAD WITHOUT CONTRAST  Technique:  Contiguous axial images were obtained from the base of the skull through the vertex without contrast.  Comparison: CT head 02/17/2012.  Findings: Moderate cerebral and cerebellar atrophy are again noted. There continue to be patchy and confluent areas of decreased attenuation throughout the deep and periventricular white matter of the cerebral hemispheres bilaterally, compatible with chronic microvascular ischemic  changes.  Well defined foci of decreased attenuation in the left basal ganglia (left caudate head extending into the anterior limb of the left internal capsule) is similar to prior study, compatible with an old lacunar infarction.  No definite acute intracranial abnormalities.  Specifically, no overt signs of acute/subacute cerebral ischemia, no hemorrhage, no focal mass, mass effect, hydrocephalus or abnormal intra or extra-axial fluid collections.  No acute displaced skull fractures are noted. The visualized paranasal sinuses and mastoids are well pneumatized.  IMPRESSION: 1.  No acute intracranial abnormalities. 2.  The  appearance of the brain is essentially unchanged compared to the prior study 02/17/2012, as detailed above.  Original Report Authenticated By: Florencia Reasons, M.D.   Dg Chest Portable 1 View  04/01/2012  *RADIOLOGY REPORT*  Clinical Data: Chest pain.  Alzheimer's disease.  PORTABLE CHEST - 1 VIEW  Comparison: 12/15/2011.  Findings: Stable enlarged cardiac silhouette.  Mild increase in prominence of the pulmonary vasculature and interstitial markings. Possible small bilateral pleural effusions.  Diffuse osteopenia.  IMPRESSION: Stable cardiomegaly with interval minimal changes of congestive heart failure.  Original Report Authenticated By: Darrol Angel, M.D.   Dg Abd Acute W/chest  03/20/2012  *RADIOLOGY REPORT*  Clinical Data:  Nausea and vomiting.  Shortness of breath and cough.  ACUTE ABDOMEN SERIES (ABDOMEN 2 VIEW & CHEST 1 VIEW)  Comparison: Portable chest 12/15/2011.  Findings: The heart is enlarged.  There is no edema or effusion to suggest failure.  The lungs are clear.  Supine and upright views of the abdomen demonstrate a nonspecific bowel gas pattern.  There is no evidence for obstruction or free air.  Degenerative changes are noted in the lumbar spine and right greater than left hip and SI joints.  Atherosclerotic calcifications are noted without evidence for aneurysm.  IMPRESSION:  1.  Cardiomegaly without failure. 2.  Nonspecific bowel gas pattern. 3.  Atherosclerosis.  Original Report Authenticated By: Jamesetta Orleans. MATTERN, M.D.    Date: 04/01/2012  Rate: 70  Rhythm: normal sinus rhythm and premature atrial contractions (PAC)  QRS Axis: left  Intervals: PR prolonged  ST/T Wave abnormalities: normal  Conduction Disutrbances:first-degree A-V block , right bundle branch block and left anterior fascicular block  Narrative Interpretation: Right bundle branch block, left anterior fascicular block. No old ECG available for comparison.  Old EKG Reviewed: none available     1.  Urinary tract infection       MDM  Weakness of uncertain cause. She will be evaluated for possible electrolyte disturbance and possible occult infection.      I personally performed the services described in this documentation, which was scribed in my presence. The recorded information has been reviewed and considered.       Dione Booze, MD 04/01/12 [phone]

## 2012-04-01 NOTE — ED Notes (Signed)
Antibiotics initiated

## 2012-04-01 NOTE — ED Notes (Signed)
Pt c/o chest pain and shortness of breath of breath today. Also c/o diarrhea yesterday. Pt has Alzheimers.

## 2012-04-01 NOTE — ED Notes (Signed)
Pt to CT

## 2012-04-03 LAB — URINE CULTURE
Colony Count: >=100000
Culture  Setup Time: 201305240155

## 2012-04-04 NOTE — ED Notes (Signed)
+  Urine. Patient prescribed Keflex. No sensitivity listed. Chart sent to EDP office for review.

## 2012-04-05 NOTE — ED Notes (Signed)
Keflex-should have adequate coverage. She needs to f/u with PCP for repeat urine once done with Abx.

## 2012-04-05 NOTE — ED Notes (Signed)
Daughter notified 

## 2012-06-03 ENCOUNTER — Other Ambulatory Visit: Payer: Self-pay | Admitting: Cardiology

## 2012-06-04 ENCOUNTER — Other Ambulatory Visit: Payer: Self-pay | Admitting: Cardiology

## 2012-06-28 ENCOUNTER — Emergency Department (HOSPITAL_COMMUNITY): Payer: Medicare Other

## 2012-06-28 ENCOUNTER — Encounter (HOSPITAL_COMMUNITY): Payer: Self-pay | Admitting: Emergency Medicine

## 2012-06-28 ENCOUNTER — Other Ambulatory Visit: Payer: Self-pay

## 2012-06-28 ENCOUNTER — Inpatient Hospital Stay (HOSPITAL_COMMUNITY)
Admission: EM | Admit: 2012-06-28 | Discharge: 2012-06-30 | DRG: 637 | Disposition: A | Discharge status: Skilled Nursing Facility | Payer: Medicare Other | Attending: Internal Medicine | Admitting: Internal Medicine

## 2012-06-28 DIAGNOSIS — J449 Chronic obstructive pulmonary disease, unspecified: Secondary | ICD-10-CM | POA: Diagnosis present

## 2012-06-28 DIAGNOSIS — J4489 Other specified chronic obstructive pulmonary disease: Secondary | ICD-10-CM | POA: Diagnosis present

## 2012-06-28 DIAGNOSIS — Z87891 Personal history of nicotine dependence: Secondary | ICD-10-CM

## 2012-06-28 DIAGNOSIS — R0602 Shortness of breath: Secondary | ICD-10-CM | POA: Diagnosis present

## 2012-06-28 DIAGNOSIS — E119 Type 2 diabetes mellitus without complications: Secondary | ICD-10-CM | POA: Diagnosis present

## 2012-06-28 DIAGNOSIS — N3000 Acute cystitis without hematuria: Secondary | ICD-10-CM | POA: Diagnosis present

## 2012-06-28 DIAGNOSIS — N289 Disorder of kidney and ureter, unspecified: Secondary | ICD-10-CM

## 2012-06-28 DIAGNOSIS — I2510 Atherosclerotic heart disease of native coronary artery without angina pectoris: Secondary | ICD-10-CM | POA: Diagnosis present

## 2012-06-28 DIAGNOSIS — E1169 Type 2 diabetes mellitus with other specified complication: Principal | ICD-10-CM | POA: Diagnosis present

## 2012-06-28 DIAGNOSIS — G9341 Metabolic encephalopathy: Secondary | ICD-10-CM

## 2012-06-28 DIAGNOSIS — I498 Other specified cardiac arrhythmias: Secondary | ICD-10-CM | POA: Diagnosis present

## 2012-06-28 DIAGNOSIS — Z7982 Long term (current) use of aspirin: Secondary | ICD-10-CM

## 2012-06-28 DIAGNOSIS — M109 Gout, unspecified: Secondary | ICD-10-CM | POA: Diagnosis present

## 2012-06-28 DIAGNOSIS — D696 Thrombocytopenia, unspecified: Secondary | ICD-10-CM | POA: Diagnosis present

## 2012-06-28 DIAGNOSIS — Z66 Do not resuscitate: Secondary | ICD-10-CM | POA: Diagnosis present

## 2012-06-28 DIAGNOSIS — R4182 Altered mental status, unspecified: Secondary | ICD-10-CM

## 2012-06-28 DIAGNOSIS — B961 Klebsiella pneumoniae [K. pneumoniae] as the cause of diseases classified elsewhere: Secondary | ICD-10-CM | POA: Diagnosis present

## 2012-06-28 DIAGNOSIS — E860 Dehydration: Secondary | ICD-10-CM

## 2012-06-28 DIAGNOSIS — Z833 Family history of diabetes mellitus: Secondary | ICD-10-CM

## 2012-06-28 DIAGNOSIS — Z79899 Other long term (current) drug therapy: Secondary | ICD-10-CM

## 2012-06-28 DIAGNOSIS — I4891 Unspecified atrial fibrillation: Secondary | ICD-10-CM | POA: Diagnosis present

## 2012-06-28 DIAGNOSIS — R0989 Other specified symptoms and signs involving the circulatory and respiratory systems: Secondary | ICD-10-CM | POA: Diagnosis present

## 2012-06-28 DIAGNOSIS — E162 Hypoglycemia, unspecified: Secondary | ICD-10-CM | POA: Diagnosis present

## 2012-06-28 DIAGNOSIS — G309 Alzheimer's disease, unspecified: Secondary | ICD-10-CM | POA: Diagnosis present

## 2012-06-28 DIAGNOSIS — I080 Rheumatic disorders of both mitral and aortic valves: Secondary | ICD-10-CM | POA: Diagnosis present

## 2012-06-28 DIAGNOSIS — I10 Essential (primary) hypertension: Secondary | ICD-10-CM | POA: Diagnosis present

## 2012-06-28 DIAGNOSIS — I5032 Chronic diastolic (congestive) heart failure: Secondary | ICD-10-CM | POA: Diagnosis present

## 2012-06-28 DIAGNOSIS — M329 Systemic lupus erythematosus, unspecified: Secondary | ICD-10-CM | POA: Diagnosis present

## 2012-06-28 DIAGNOSIS — Z794 Long term (current) use of insulin: Secondary | ICD-10-CM

## 2012-06-28 DIAGNOSIS — I509 Heart failure, unspecified: Secondary | ICD-10-CM | POA: Diagnosis present

## 2012-06-28 DIAGNOSIS — F0390 Unspecified dementia without behavioral disturbance: Secondary | ICD-10-CM

## 2012-06-28 DIAGNOSIS — N390 Urinary tract infection, site not specified: Secondary | ICD-10-CM

## 2012-06-28 DIAGNOSIS — F0280 Dementia in other diseases classified elsewhere without behavioral disturbance: Secondary | ICD-10-CM | POA: Diagnosis present

## 2012-06-28 DIAGNOSIS — N179 Acute kidney failure, unspecified: Secondary | ICD-10-CM

## 2012-06-28 DIAGNOSIS — I059 Rheumatic mitral valve disease, unspecified: Secondary | ICD-10-CM | POA: Diagnosis present

## 2012-06-28 DIAGNOSIS — E7800 Pure hypercholesterolemia, unspecified: Secondary | ICD-10-CM | POA: Diagnosis present

## 2012-06-28 LAB — CBC WITH DIFFERENTIAL/PLATELET
Hemoglobin: 11.3 g/dL — ABNORMAL LOW (ref 12.0–15.0)
Lymphocytes Relative: 14 % (ref 12–46)
Lymphs Abs: 0.8 10*3/uL (ref 0.7–4.0)
Monocytes Relative: 5 % (ref 3–12)
Neutro Abs: 4.8 10*3/uL (ref 1.7–7.7)
Neutrophils Relative %: 80 % — ABNORMAL HIGH (ref 43–77)
RBC: 3.36 MIL/uL — ABNORMAL LOW (ref 3.87–5.11)

## 2012-06-28 LAB — URINE MICROSCOPIC-ADD ON

## 2012-06-28 LAB — GLUCOSE, CAPILLARY
Glucose-Capillary: 28 mg/dL — CL (ref 70–99)
Glucose-Capillary: 68 mg/dL — ABNORMAL LOW (ref 70–99)

## 2012-06-28 LAB — COMPREHENSIVE METABOLIC PANEL
Albumin: 2.7 g/dL — ABNORMAL LOW (ref 3.5–5.2)
Alkaline Phosphatase: 79 U/L (ref 39–117)
BUN: 32 mg/dL — ABNORMAL HIGH (ref 6–23)
CO2: 31 mEq/L (ref 19–32)
Chloride: 98 mEq/L (ref 96–112)
GFR calc Af Amer: 28 mL/min — ABNORMAL LOW (ref >90)
GFR calc non Af Amer: 24 mL/min — ABNORMAL LOW (ref >90)
Glucose, Bld: 154 mg/dL — ABNORMAL HIGH (ref 70–99)
Potassium: 3.9 mEq/L (ref 3.5–5.1)
Total Bilirubin: 0.3 mg/dL (ref 0.3–1.2)

## 2012-06-28 LAB — CARDIAC PANEL(CRET KIN+CKTOT+MB+TROPI)
Relative Index: INVALID (ref 0.0–2.5)
Troponin I: <0.3 ng/mL (ref <0.30)

## 2012-06-28 LAB — URINALYSIS, ROUTINE W REFLEX MICROSCOPIC
Bilirubin Urine: NEGATIVE
Ketones, ur: NEGATIVE mg/dL
Leukocytes, UA: NEGATIVE
Nitrite: NEGATIVE
Specific Gravity, Urine: 1.025 (ref 1.005–1.030)
Urobilinogen, UA: 0.2 mg/dL (ref 0.0–1.0)

## 2012-06-28 LAB — CLOSTRIDIUM DIFFICILE BY PCR: Toxigenic C. Difficile by PCR: NEGATIVE

## 2012-06-28 LAB — PRO B NATRIURETIC PEPTIDE: Pro B Natriuretic peptide (BNP): 546.1 pg/mL — ABNORMAL HIGH (ref 0–450)

## 2012-06-28 LAB — MRSA PCR SCREENING: MRSA by PCR: NEGATIVE

## 2012-06-28 LAB — APTT: aPTT: 34 seconds (ref 24–37)

## 2012-06-28 MED ORDER — ALUM & MAG HYDROXIDE-SIMETH 200-200-20 MG/5ML PO SUSP: 30.0000 mL | Freq: Four times a day (QID) | ORAL | Status: DC | PRN

## 2012-06-28 MED ORDER — AMLODIPINE BESYLATE 5 MG PO TABS
5.0000 mg | ORAL_TABLET | Freq: Every day | ORAL | Status: DC
  Administered 2012-06-30: 5 mg via ORAL
  Filled 2012-06-28: qty 1

## 2012-06-28 MED ORDER — ISOSORBIDE MONONITRATE 20 MG PO TABS
20.0000 mg | ORAL_TABLET | Freq: Two times a day (BID) | ORAL | Status: DC
  Administered 2012-06-29 – 2012-06-30 (×2): 20 mg via ORAL
  Filled 2012-06-28 (×2): qty 1

## 2012-06-28 MED ORDER — DEXTROSE 50 % IV SOLN
INTRAVENOUS | Status: AC
  Administered 2012-06-28: 25 g via INTRAVENOUS
  Filled 2012-06-28: qty 50

## 2012-06-28 MED ORDER — SODIUM CHLORIDE 0.9 % IV SOLN: INTRAVENOUS | Status: DC

## 2012-06-28 MED ORDER — ONDANSETRON HCL 4 MG PO TABS: 4.0000 mg | ORAL_TABLET | Freq: Four times a day (QID) | ORAL | Status: DC | PRN

## 2012-06-28 MED ORDER — SODIUM CHLORIDE 0.9 % IV BOLUS (SEPSIS)
500.0000 mL | Freq: Once | INTRAVENOUS | Status: AC
  Administered 2012-06-28: 500 mL via INTRAVENOUS

## 2012-06-28 MED ORDER — ENOXAPARIN SODIUM 30 MG/0.3ML ~~LOC~~ SOLN: 30.0000 mg | SUBCUTANEOUS | Status: DC

## 2012-06-28 MED ORDER — ALPRAZOLAM 0.5 MG PO TABS
1.0000 mg | ORAL_TABLET | Freq: Once | ORAL | Status: AC
  Administered 2012-06-28: 1 mg via ORAL
  Filled 2012-06-28 (×2): qty 1

## 2012-06-28 MED ORDER — PANTOPRAZOLE SODIUM 40 MG PO TBEC
40.0000 mg | DELAYED_RELEASE_TABLET | Freq: Every day | ORAL | Status: DC
  Administered 2012-06-30: 40 mg via ORAL
  Filled 2012-06-28: qty 1

## 2012-06-28 MED ORDER — ALPRAZOLAM 0.5 MG PO TABS
0.5000 mg | ORAL_TABLET | Freq: Every day | ORAL | Status: DC
  Administered 2012-06-29: 0.5 mg via ORAL
  Filled 2012-06-28: qty 1

## 2012-06-28 MED ORDER — DEXTROSE-NACL 5-0.45 % IV SOLN
INTRAVENOUS | Status: AC
  Administered 2012-06-28: 18:00:00 via INTRAVENOUS

## 2012-06-28 MED ORDER — DEXTROSE 50 % IV SOLN
25.0000 mL | Freq: Once | INTRAVENOUS | Status: AC
  Administered 2012-06-28: 25 mL via INTRAVENOUS

## 2012-06-28 MED ORDER — ALBUTEROL SULFATE (5 MG/ML) 0.5% IN NEBU: 2.5000 mg | INHALATION_SOLUTION | RESPIRATORY_TRACT | Status: DC | PRN

## 2012-06-28 MED ORDER — DEXTROSE 50 % IV SOLN
25.0000 g | Freq: Once | INTRAVENOUS | Status: AC
  Administered 2012-06-28: 25 g via INTRAVENOUS

## 2012-06-28 MED ORDER — VITAMIN B-12 1000 MCG PO TABS
1000.0000 ug | ORAL_TABLET | Freq: Every day | ORAL | Status: DC
  Administered 2012-06-30: 1000 ug via ORAL
  Filled 2012-06-28: qty 1

## 2012-06-28 MED ORDER — ALLOPURINOL 100 MG PO TABS
100.0000 mg | ORAL_TABLET | Freq: Every day | ORAL | Status: DC
  Administered 2012-06-30: 100 mg via ORAL
  Filled 2012-06-28: qty 1

## 2012-06-28 MED ORDER — SIMVASTATIN 10 MG PO TABS
10.0000 mg | ORAL_TABLET | Freq: Every day | ORAL | Status: DC
  Administered 2012-06-29: 10 mg via ORAL
  Filled 2012-06-28: qty 1

## 2012-06-28 MED ORDER — ACETAMINOPHEN 650 MG RE SUPP: 650.0000 mg | Freq: Four times a day (QID) | RECTAL | Status: DC | PRN

## 2012-06-28 MED ORDER — DEXTROSE 50 % IV SOLN
INTRAVENOUS | Status: AC
  Filled 2012-06-28: qty 50

## 2012-06-28 MED ORDER — ACETAMINOPHEN 325 MG PO TABS: 650.0000 mg | ORAL_TABLET | Freq: Four times a day (QID) | ORAL | Status: DC | PRN

## 2012-06-28 MED ORDER — ONDANSETRON HCL 4 MG/2ML IJ SOLN: 4.0000 mg | Freq: Four times a day (QID) | INTRAMUSCULAR | Status: DC | PRN

## 2012-06-28 MED ORDER — GUAIFENESIN-DM 100-10 MG/5ML PO SYRP: 5.0000 mL | ORAL_SOLUTION | ORAL | Status: DC | PRN

## 2012-06-28 MED ORDER — ASPIRIN 81 MG PO CHEW
81.0000 mg | CHEWABLE_TABLET | Freq: Every day | ORAL | Status: DC
  Administered 2012-06-30: 81 mg via ORAL
  Filled 2012-06-28: qty 1

## 2012-06-28 MED ORDER — ADULT MULTIVITAMIN W/MINERALS CH
1.0000 | ORAL_TABLET | Freq: Every day | ORAL | Status: DC
  Administered 2012-06-30: 1 via ORAL
  Filled 2012-06-28: qty 1

## 2012-06-28 MED ORDER — DEXTROSE 5 % IV SOLN
1.0000 g | INTRAVENOUS | Status: DC
  Administered 2012-06-28 – 2012-06-29 (×2): 1 g via INTRAVENOUS
  Filled 2012-06-28 (×3): qty 10

## 2012-06-28 NOTE — ED Notes (Signed)
EMS was called to nursing center for ? Stroke. Per nursing facility see was having rt sided weakness. Pt is not talking and is demented at baseline. Pt's blood sugar this am was 40 the facility treated her with glucose gel and ems stated her blood sugar was 89 when they rechecked it. Pt was last seen normal last night at 7pm.

## 2012-06-28 NOTE — ED Provider Notes (Signed)
History   This chart was scribed for Ward Givens, MD by Charolett Bumpers . The patient was seen in room APA05/APA05. Patient's care was started at 0829.    CSN: [id number]  Arrival date & time 06/28/12  0830   First MD Initiated Contact with Patient 06/28/12 [phone]      Chief Complaint  Patient presents with  . Hypoglycemia  . Stroke Symptoms    (Consider location/radiation/quality/duration/timing/severity/associated sxs/prior Treatment)  Level 5 Caveat: Dementia  HPI [name] is a 78 y.o. female who has a h/o CHF, Diabetes, HTN, hyperlipidemia, and Alzheimer's, presents to the Emergency Department complaining of constant altered mental status with an onset of earlier this morning. Daughter states that [name] was called by nursing home and told that they were unable to wake patient up and they thought [name] had had a stsroke. Daughter also states that the pt's head is leaning to the right and [name] is laying to the right. Daughter reports that the pt is normally able to hold head up straight, verbal and confused. Daughter reports that the pt is not as talkative as  normally. Family denies any recent falls or head trauma. Pt's blood sugar was 40 this morning at nursing facility, pt was treated with glucose gel and per EMS, pt blood sugar was 89 upon recheck. Family reports that the pt was eating yesterday per facility's report, but states that her appetite is intermittent. They report [name] still isn't back to her baseline after her hypoglycemia has been treated.   Pt reports a h/o diabetes, alzheimer's, valve problems and MI when [name] was 52. Family denies any h/o CVA. Family reports that the pt takes aspirin daily but denies any anticoagulants.   PCP: Dr. Sherril Croon in [place]  Past Medical History  Diagnosis Date  . CHF (congestive heart failure)   . COPD (chronic obstructive pulmonary disease)   . Diabetes mellitus   . A-fib   . Lupus   . Alzheimer's dementia   . Aortic disease   .  Hypertension   . Gout   . High cholesterol     Past Surgical History  Procedure Date  . Cholecystectomy   . Foot surgery   . Tubal ligation   . Tympanostomy tube placement     Family History  Problem Relation Age of Onset  . Diabetes Sister   . Diabetes Brother     History  Substance Use Topics  . Smoking status: Never Smoker   . Smokeless tobacco: Never Used  . Alcohol Use: No   Pt lives in a nursing facility.   OB History    Grav Para Term Preterm Abortions TAB SAB Ect Mult Living   13    2  2   9       Review of Systems  Unable to perform ROS: Dementia    Allergies  Haloperidol lactate; Adhesive; and Lorazepam  Home Medications   Current Outpatient Rx  Name Route Sig Dispense Refill  . ALBUTEROL SULFATE HFA 108 (90 BASE) MCG/ACT IN AERS Inhalation Inhale 2 puffs into the lungs every 4 (four) hours as needed for wheezing or shortness of breath. 1 Inhaler 2  . ALBUTEROL SULFATE (2.5 MG/3ML) 0.083% IN NEBU Nebulization Take 3 mLs (2.5 mg total) by nebulization every 4 (four) hours as needed for wheezing or shortness of breath. For shortness of breath 75 mL 0  . ALLOPURINOL 100 MG PO TABS Oral Take 100 mg by mouth every morning.     Marland Kitchen  ALPRAZOLAM 0.25 MG PO TABS Oral Take 0.25 mg by mouth daily as needed. For anxiety. **GIVE ONE TABLET AT BEDTIME. MAY GIVE ONE TABLET DAILY AS NEEDED**    . ASPIRIN 81 MG PO CHEW Oral Chew 81 mg by mouth every morning.    Marland Kitchen HYDROCODONE-ACETAMINOPHEN 7.5-325 MG PO TABS Oral Take 1 tablet by mouth as needed. Patient takes a 1/2 tablet as needed for pain    . INSULIN ASPART PROT & ASPART (70-30) 100 UNIT/ML [place] SUSP Subcutaneous Inject into the skin as directed. **MORNING DOSE: IF BLOOD SUGAR IS 150 OR MORE=36 UNITS-IF 150 AND UNDER GIVE 32 UNITS. EVENING DOSE: IF BLOOD SUGAR IS LOWER THAN 150 GIVE 26 UNITS. IF LEVELS ARE 150 OR MORE GIVE 30 UNITS**    . ISOSORBIDE MONONITRATE 20 MG PO TABS Oral Take 20 mg by mouth 2 (two) times daily.       . NORVASC 2.5 MG PO TABS  TAKE (1) TABLET BY MOUTH ONCE DAILY. 30 each 0    PATIENT NEED OFFICE VISIT/MUST CALL OFFICE TO SCHE ...  . OMEPRAZOLE 20 MG PO CPDR Oral Take 20 mg by mouth every morning.     Marland Kitchen SIMVASTATIN 20 MG PO TABS Oral Take 20 mg by mouth 2 (two) times daily.     Marland Kitchen VITAMIN B-12 1000 MCG PO TABS Oral Take 1,000 mcg by mouth every morning.       BP 179/82  Pulse 52  Temp 97.6 F (36.4 C) (Axillary)  Resp 20  SpO2 99%  Vital signs normal except hypertension, bradycardia   Physical Exam  Nursing note and vitals reviewed. Constitutional: [name] appears well-developed and well-nourished. No distress.  HENT:  Head: Normocephalic and atraumatic.  Right Ear: External ear normal.  Left Ear: External ear normal.  Nose: Nose normal.       Pt refuses to open mouth.   Eyes: Conjunctivae and EOM are normal. Pupils are equal, round, and reactive to light.  Neck: Neck supple. No tracheal deviation present.  Cardiovascular: Normal rate, regular rhythm and normal heart sounds.  Exam reveals no gallop and no friction rub.   No murmur heard. Pulmonary/Chest: Effort normal and breath sounds normal. No respiratory distress. [name] has no wheezes. [name] has no rales. [name] exhibits no tenderness.  Abdominal: Soft. Bowel sounds are normal. [name] exhibits no distension. There is no tenderness. There is no rebound and no guarding.  Musculoskeletal: Normal range of motion. [name] exhibits no edema.       No edema of lower extremities.   Neurological: [name] is alert. No sensory deficit.       Doesn't follow commands, makes brief eye contact. Unable to assess weakness in extremities or cranial nerves.   Skin: Skin is warm and dry.       Small red excoriations on knees.     ED Course  Procedures (including critical care time)   Medications  ALPRAZolam (XANAX) tablet 1 mg (1 mg Oral Given 06/28/12 1001)  sodium chloride 0.9 % bolus 500 mL (500 mL Intravenous Given 06/28/12 1155)     COORDINATION OF  CARE:  08:59-Discussed planned course of treatment with the family including head CT, chest x-ray, Xanax, blood work and UA , who are agreeable at this time.   09:15-Medication Orders: Alprazolam (Xanax) tablet 1 mg-once. Daughter states haldol and ativan makes her agitated. States xanax works quickly in 10 minutes.  09:47 nurse reports [name] can't do a swallow screen on the patient because [name] won't cooperate.  Will try putting it under her tongue.   11:10-Family states that the pt is on an abx, will retrieve medical records from nursing home.   12:35-Recheck: Informed family of imaging and lab results. Pt was started on Linezolin 600 mg BID x 10 d started on 06/19/12. Will give pt fluids and will consult hospitalist for possible admission. Family also report when [name] was living at home they did not give her Lasix every day but [name]'s been getting Lasix on a regular basis in the nursing home for swelling. It appears [name] may be drying out from having had the Lasix.  13:41 Dr Shary Key, admit to obs, tele, team 2  Results for orders placed during the hospital encounter of 06/28/12  GLUCOSE, CAPILLARY      Component Value Range   Glucose-Capillary 127 (*) 70 - 99 mg/dL  URINALYSIS, ROUTINE W REFLEX MICROSCOPIC      Component Value Range   Color, Urine YELLOW  YELLOW   APPearance CLEAR  CLEAR   Specific Gravity, Urine 1.025  1.005 - 1.030   pH 6.5  5.0 - 8.0   Glucose, UA NEGATIVE  NEGATIVE mg/dL   Hgb urine dipstick TRACE (*) NEGATIVE   Bilirubin Urine NEGATIVE  NEGATIVE   Ketones, ur NEGATIVE  NEGATIVE mg/dL   Protein, ur 562 (*) NEGATIVE mg/dL   Urobilinogen, UA 0.2  0.0 - 1.0 mg/dL   Nitrite NEGATIVE  NEGATIVE   Leukocytes, UA NEGATIVE  NEGATIVE  CBC WITH DIFFERENTIAL      Component Value Range   WBC 6.0  4.0 - 10.5 K/uL   RBC 3.36 (*) 3.87 - 5.11 MIL/uL   Hemoglobin 11.3 (*) 12.0 - 15.0 g/dL   HCT 13.0 (*) 86.5 - 78.4 %   MCV 99.1  78.0 - 100.0 fL   MCH 33.6  26.0 - 34.0 pg    MCHC 33.9  30.0 - 36.0 g/dL   RDW 69.6  29.5 - 28.4 %   Platelets 120 (*) 150 - 400 K/uL   Neutrophils Relative 80 (*) 43 - 77 %   Neutro Abs 4.8  1.7 - 7.7 K/uL   Lymphocytes Relative 14  12 - 46 %   Lymphs Abs 0.8  0.7 - 4.0 K/uL   Monocytes Relative 5  3 - 12 %   Monocytes Absolute 0.3  0.1 - 1.0 K/uL   Eosinophils Relative 1  0 - 5 %   Eosinophils Absolute 0.0  0.0 - 0.7 K/uL   Basophils Relative 0  0 - 1 %   Basophils Absolute 0.0  0.0 - 0.1 K/uL  COMPREHENSIVE METABOLIC PANEL      Component Value Range   Sodium 137  135 - 145 mEq/L   Potassium 3.9  3.5 - 5.1 mEq/L   Chloride 98  96 - 112 mEq/L   CO2 31  19 - 32 mEq/L   Glucose, Bld 154 (*) 70 - 99 mg/dL   BUN 32 (*) 6 - 23 mg/dL   Creatinine, Ser 1.32 (*) 0.50 - 1.10 mg/dL   Calcium 9.5  8.4 - 44.0 mg/dL   Total Protein 6.5  6.0 - 8.3 g/dL   Albumin 2.7 (*) 3.5 - 5.2 g/dL   AST 34  0 - 37 U/L   ALT 18  0 - 35 U/L   Alkaline Phosphatase 79  39 - 117 U/L   Total Bilirubin 0.3  0.3 - 1.2 mg/dL   GFR calc non Af Amer 24 (*) >90  mL/min   GFR calc Af Amer 28 (*) >90 mL/min  APTT      Component Value Range   aPTT 34  24 - 37 seconds  PROTIME-INR      Component Value Range   Prothrombin Time 14.9  11.6 - 15.2 seconds   INR 1.15  0.00 - 1.49  TROPONIN I      Component Value Range   Troponin I <0.30  <0.30 ng/mL  URINE MICROSCOPIC-ADD ON      Component Value Range   WBC, UA 3-6  <3 WBC/hpf   RBC / HPF 3-6  <3 RBC/hpf   Bacteria, UA MANY (*) RARE   Laboratory interpretation all normal except new renal insufficiency, stable anemia, possible UTI    Dg Chest 1 View  06/28/2012  *RADIOLOGY REPORT*  Clinical Data: Hypoglycemia, stroke symptoms, COPD, CHF, hypertension, atrial fibrillation  CHEST - 1 VIEW  Comparison: 04/01/2012  Findings: Enlargement of cardiac silhouette with pulmonary vascular congestion. Atherosclerotic calcification thoracic aorta. Scattered interstitial changes question mild interstitial edema. No  definite focal segmental consolidation, pleural effusion or pneumothorax. Bones appear demineralized.  IMPRESSION: Question mild CHF.   Original Report Authenticated By: Lollie Marrow, M.D. ( 06/28/2012 09:40:54 )    Ct Head Wo Contrast  06/28/2012  *RADIOLOGY REPORT*  Clinical Data: Altered mental status, noncommunicative, history dementia, lupus, hypertension, CHF, COPD  CT HEAD WITHOUT CONTRAST  Technique:  Contiguous axial images were obtained from the base of the skull through the vertex without contrast.  Comparison: 04/01/2012  Findings: Generalized atrophy. Normal ventricular morphology. No midline shift or mass effect. Small vessel chronic ischemic changes of deep cerebral white matter. Small old lacunar infarct left caudate. No intracranial hemorrhage, mass lesion, or acute infarction. Visualized paranasal sinuses and mastoid air cells clear. Bones unremarkable. Atherosclerotic calcifications at skull base.  IMPRESSION: Atrophy with small vessel chronic ischemic changes of deep cerebral white matter. Old lacunar infarct left caudate. No acute intracranial abnormalities. No interval change.   Original Report Authenticated By: Lollie Marrow, M.D. ( 06/28/2012 09:42:25 )       Date: 06/28/2012  Rate: 73  Rhythm: normal sinus rhythm  QRS Axis: left  Intervals: normal  ST/T Wave abnormalities: normal  Conduction Disutrbances:right bundle branch block  Narrative Interpretation:   Old EKG Reviewed: unchanged from 04/01/2012    1. Altered mental status   2. Renal insufficiency   3. Urinary tract infection   4. Hypoglycemia    Plan admission to observation  Devoria Albe, MD, FACEP    MDM  I personally performed the services described in this documentation, which was scribed in my presence. The recorded information has been reviewed and considered.  Devoria Albe, MD, FACEP      Ward Givens, MD 06/28/12 [phone]

## 2012-06-28 NOTE — ED Notes (Signed)
Blood sugar rechecked-142. EDP made aware. Approval given to take patient to floor.

## 2012-06-28 NOTE — H&P (Signed)
History and Physical  JALA DUNDON AOZ:[id number] DOB: 05-18-1924 DOA:   Referring physician: PCP: Ignatius Specking., MD   Chief Complaint: Decreased mentation at the nursing home.  HPI:  Patient is an 78 year old white female with past medical history significant for congestive heart failure, COPD, diabetes, atrial fibrillation, hypertension and gout. Per discussion with patient's daughter as patient is unable to give history, patient developed decreased mental status at the nursing home. Her blood sugar was checked and it was 40. Patient was given D50 blood sugar improved to 89. EMS was called. Patient's blood sugar now is normalized but she continues to have altered mental status. Unsure how long blood sugars have been low since patient is unable to tell. She does not complain of any chest pain no shortness of breath no nausea or vomiting. Per discussion with daughter patient has dementia and normally cannot tell you how she feels. she normally is more talkative. Daughter thinks patient was also leaning toward the right side initially. Patient is non-mobile at baseline. But she normally is able to hold her head up straight.  Chart Review:  None  Review of Systems:  Patient unable to give history secondary to dementia  Past Medical History  Diagnosis Date  . CHF (congestive heart failure)   . COPD (chronic obstructive pulmonary disease)   . Diabetes mellitus   . A-fib   . Lupus   . Alzheimer's dementia   . Aortic disease   . Hypertension   . Gout   . High cholesterol     Past Surgical History  Procedure Date  . Cholecystectomy   . Foot surgery   . Tubal ligation   . Tympanostomy tube placement     Social History:Reports that she quit tobacco over 35 years. She has never used smokeless tobacco. She reports that she does not drink alcohol or use illicit drugs.  She has 13 children 9 is presently alive. She is retired.  Allergies  Allergen Reactions  . Haloperidol Lactate    REACTION: aggressive (HALDOL)  . Adhesive (Tape)   . Lorazepam     REACTION: hallucinate (ATIVAN)    Family History  Problem Relation Age of Onset  . Diabetes Sister   . Diabetes Brother      Prior to Admission medications   Medication Sig Start Date End Date Taking? Authorizing Provider  allopurinol (ZYLOPRIM) 100 MG tablet Take 100 mg by mouth every morning.    Yes Historical Provider, MD  ALPRAZolam (XANAX) 0.25 MG tablet Take 0.25 mg by mouth 3 (three) times daily as needed. Anxiety   Yes Historical Provider, MD  ALPRAZolam (XANAX) 0.5 MG tablet Take 0.5 mg by mouth at bedtime.   Yes Historical Provider, MD  amLODipine (NORVASC) 5 MG tablet Take 5 mg by mouth daily.   Yes Historical Provider, MD  aspirin 81 MG chewable tablet Chew 81 mg by mouth every morning.   Yes Historical Provider, MD  cyanocobalamin 500 MCG tablet Take 1,000 mcg by mouth daily.   Yes Historical Provider, MD  furosemide (LASIX) 20 MG tablet Take 20 mg by mouth daily as needed. Swelling   Yes Historical Provider, MD  isosorbide mononitrate (ISMO,MONOKET) 20 MG tablet Take 20 mg by mouth 2 (two) times daily.    Yes Historical Provider, MD  linezolid (ZYVOX) 600 MG tablet Take 600 mg by mouth every 12 (twelve) hours.   Yes Historical Provider, MD  loperamide (IMODIUM) 2 MG capsule Take 2 mg by mouth 4 (four) times  daily as needed. Loose Stools   Yes Historical Provider, MD  omeprazole (PRILOSEC) 20 MG capsule Take 20 mg by mouth every morning.    Yes Historical Provider, MD  simvastatin (ZOCOR) 10 MG tablet Take 10 mg by mouth at bedtime.   Yes Historical Provider, MD   Physical Exam: Filed Vitals:   06/28/12 1400 06/28/12 1431 06/28/12 1500 06/28/12 1515  BP: 174/54  159/77   Pulse:      Temp:      TempSrc:      Resp:      SpO2:  97%  98%     General: Patient on the stretcher. She does follow some commands. Opens her eyes but then closes them again.   HEENT: Head is normocephalic atraumatic her  pupils are reactive to light her throat is without erythema.  Cardiovascular: Bradycardic, no rubs or gallops  Respiratory: clear to auscultations bilaterally  Abdomen: Soft nontender nondistended positive bowel sounds   Skin: Dry and intact  Musculoskeletal: See intact Neurologic: Patient able to move all extremities. She opens her eyes to questioning follow some commands. Wt Readings from Last 3 Encounters:  03/21/12 86.46 kg (190 lb 9.8 oz)  02/26/12 90.719 kg (200 lb)  10/10/11 90.719 kg (200 lb)    Labs on Admission:  Basic Metabolic Panel:  Lab 06/28/12 4540  NA 137  K 3.9  CL 98  CO2 31  GLUCOSE 154*  BUN 32*  CREATININE 1.80*  CALCIUM 9.5  MG --  PHOS --    Liver Function Tests:  Lab 06/28/12 0901  AST 34  ALT 18  ALKPHOS 79  BILITOT 0.3  PROT 6.5  ALBUMIN 2.7*    CBC:  Lab 06/28/12 0901  WBC 6.0  NEUTROABS 4.8  HGB 11.3*  HCT 33.3*  MCV 99.1  PLT 120*    Cardiac Enzymes:  Lab 06/28/12 0901  CKTOTAL --  CKMB --  CKMBINDEX --  TROPONINI <0.30    BNP (last 3 results)  Basename 12/15/11 1701 10/10/11 1315  PROBNP 105.2 85.4    CBG:  Lab 06/28/12 1522 06/28/12 0850  GLUCAP 28* 127*     Radiological Exams on Admission: Dg Chest 1 View  06/28/2012  *RADIOLOGY REPORT*  Clinical Data: Hypoglycemia, stroke symptoms, COPD, CHF, hypertension, atrial fibrillation  CHEST - 1 VIEW  Comparison: 04/01/2012  Findings: Enlargement of cardiac silhouette with pulmonary vascular congestion. Atherosclerotic calcification thoracic aorta. Scattered interstitial changes question mild interstitial edema. No definite focal segmental consolidation, pleural effusion or pneumothorax. Bones appear demineralized.  IMPRESSION: Question mild CHF.   Original Report Authenticated By: Lollie Marrow, M.D. ( 06/28/2012 09:40:54 )    Ct Head Wo Contrast  06/28/2012  *RADIOLOGY REPORT*  Clinical Data: Altered mental status, noncommunicative, history dementia, lupus,  hypertension, CHF, COPD  CT HEAD WITHOUT CONTRAST  Technique:  Contiguous axial images were obtained from the base of the skull through the vertex without contrast.  Comparison: 04/01/2012  Findings: Generalized atrophy. Normal ventricular morphology. No midline shift or mass effect. Small vessel chronic ischemic changes of deep cerebral white matter. Small old lacunar infarct left caudate. No intracranial hemorrhage, mass lesion, or acute infarction. Visualized paranasal sinuses and mastoid air cells clear. Bones unremarkable. Atherosclerotic calcifications at skull base.  IMPRESSION: Atrophy with small vessel chronic ischemic changes of deep cerebral white matter. Old lacunar infarct left caudate. No acute intracranial abnormalities. No interval change.   Original Report Authenticated By: Lollie Marrow, M.D. ( 06/28/2012 09:42:25 )  EKG: I   Assessment/Plan DM type 2 (diabetes mellitus, type 2)/ Altered mental status/ Hypoglycemia Patient has diabetes and is normally on 70/30 insulin. Per discussion with daughter sometimes her appetite is decreased. Patient most likely is hypoglycemic secondary to decreased by mouth intake. Blood sugar dropped to 40 and this most likely caused altered mental status. CT of the chest was negative for acute finding. Will monitor patient for the next 24 hours on D5 half normal saline if her sugars are maintained stable then she most likely can be discharged and followup in the nursing home. Patient normally has dementia and is not very far from her baseline.  Acute renal insufficiency Patient's kidney function is slightly increased. This is most likely secondary to acute renal failure from dehydration. Patient was getting daily Lasix. Per discussion with daughter when patient was at home she would give her Lasix as needed. Will hydrate and monitor creatinine.  Acute cystitis Patient was on Zyvox outpatient. Will switch her to Rocephin inpatient and sent for urine  cultures.  MITRAL INUFFICIENCY/CAD, NATIVE VESSEL/ BRADYCARDIA/ Chronic diastolic heart failure Patient does not have any chest pain. Will cycle cardiac markers. Lasix when necessary. Per discussion with daughter patient does have blockage of the coronary vessel but she is not a candidate for intervention.  Check 2-D echo.   DYSPNEA/ CAROTID BRUIT Stable, continue oxygen therapy   Dehydration IV fluids and monitor. Patient does have a history of congestive heart failure. Will check BNP. Will also check 2-D echo. Question of vascular congestion on chest x-ray.   Dementia Stable   Code Status: DO NOT RESUSCITATE per discussion with patient's daughter who is the power of attorney. Family Communication: Discussed with patient's daughter Disposition Plan: Skilled nursing facility when medically stable.  Time spent: 60 minutes  Molli Posey, MD  [facility] Pager [phone] If 7PM-7AM, please contact floor/night-coverage at [email or web address], password [facility] 06/28/2012, 3:32 PM

## 2012-06-28 NOTE — ED Notes (Signed)
NIH scale not done because pt will not verbalize any understanding of what I tell her to do. Pt just stares at me, but is voluntarily moving her arms and legs.

## 2012-06-28 NOTE — ED Notes (Addendum)
Informed EDP that pt failed her stroke swallow screen. EDP stated to place xanax 0.5mg   under the patient's tongue.

## 2012-06-29 ENCOUNTER — Inpatient Hospital Stay (HOSPITAL_COMMUNITY): Payer: Medicare Other

## 2012-06-29 DIAGNOSIS — N179 Acute kidney failure, unspecified: Secondary | ICD-10-CM | POA: Diagnosis present

## 2012-06-29 DIAGNOSIS — I517 Cardiomegaly: Secondary | ICD-10-CM

## 2012-06-29 LAB — GLUCOSE, CAPILLARY
Glucose-Capillary: 102 mg/dL — ABNORMAL HIGH (ref 70–99)
Glucose-Capillary: 102 mg/dL — ABNORMAL HIGH (ref 70–99)
Glucose-Capillary: 87 mg/dL (ref 70–99)
Glucose-Capillary: 74 mg/dL (ref 70–99)
Glucose-Capillary: 83 mg/dL (ref 70–99)
Glucose-Capillary: 84 mg/dL (ref 70–99)
Glucose-Capillary: 91 mg/dL (ref 70–99)
Glucose-Capillary: 89 mg/dL (ref 70–99)
Glucose-Capillary: 110 mg/dL — ABNORMAL HIGH (ref 70–99)
Glucose-Capillary: 108 mg/dL — ABNORMAL HIGH (ref 70–99)

## 2012-06-29 LAB — BASIC METABOLIC PANEL
BUN: 24 mg/dL — ABNORMAL HIGH (ref 6–23)
CO2: 28 mEq/L (ref 19–32)
Calcium: 9.4 mg/dL (ref 8.4–10.5)
Creatinine, Ser: 1.33 mg/dL — ABNORMAL HIGH (ref 0.50–1.10)
GFR calc Af Amer: 41 mL/min — ABNORMAL LOW (ref >90)

## 2012-06-29 LAB — TSH: TSH: 1.355 u[IU]/mL (ref 0.350–4.500)

## 2012-06-29 LAB — CBC
MCHC: 34.4 g/dL (ref 30.0–36.0)
MCV: 98.3 fL (ref 78.0–100.0)
Platelets: 118 10*3/uL — ABNORMAL LOW (ref 150–400)
RDW: 13.7 % (ref 11.5–15.5)
WBC: 3.8 10*3/uL — ABNORMAL LOW (ref 4.0–10.5)

## 2012-06-29 LAB — CARDIAC PANEL(CRET KIN+CKTOT+MB+TROPI)
CK, MB: 2.8 ng/mL (ref 0.3–4.0)
Relative Index: INVALID (ref 0.0–2.5)
Relative Index: INVALID (ref 0.0–2.5)
Total CK: 40 U/L (ref 7–177)
Troponin I: <0.3 ng/mL (ref <0.30)

## 2012-06-29 MED ORDER — ALPRAZOLAM 0.5 MG PO TABS
0.5000 mg | ORAL_TABLET | Freq: Once | ORAL | Status: AC
  Administered 2012-06-29: 0.5 mg via ORAL
  Filled 2012-06-29: qty 1

## 2012-06-29 MED ORDER — ENOXAPARIN SODIUM 40 MG/0.4ML ~~LOC~~ SOLN
40.0000 mg | SUBCUTANEOUS | Status: DC
  Administered 2012-06-29: 40 mg via SUBCUTANEOUS
  Filled 2012-06-29: qty 0.4

## 2012-06-29 MED ORDER — FUROSEMIDE 20 MG PO TABS: 20.0000 mg | ORAL_TABLET | Freq: Every day | ORAL | Status: DC | PRN

## 2012-06-29 NOTE — Progress Notes (Signed)
UR Chart Review Completed  

## 2012-06-29 NOTE — Clinical Social Work Psychosocial (Signed)
Clinical Social Work Department BRIEF PSYCHOSOCIAL ASSESSMENT 06/29/2012  Patient:  [name], [name]     Account Number:  [id number]     Admit date:  06/28/2012  Clinical Social Worker:  Nancie Neas  Date/Time:  06/29/2012 09:15 AM  Referred by:  RN  Date Referred:  06/29/2012 Referred for  SNF Placement   Other Referral:   Interview type:  Family Other interview type:   [name]- daughter    PSYCHOSOCIAL DATA Living Status:  FACILITY Admitted from facility:  [facility] Level of care:  Skilled Nursing Facility Primary support name:  [name] Primary support relationship to patient:  CHILD, ADULT Degree of support available:   very supportive    CURRENT CONCERNS Current Concerns  Post-Acute Placement   Other Concerns:    SOCIAL WORK ASSESSMENT / PLAN CSW spoke with pt's daughter [name] as pt not alert during visit. Pt is demented at baseline. Family appears to be very involved and supportive. [name] reports she visits pt at least daily at [facility] where she has been a resident for the past few weeks. [name] states pt has been working with PT but family is unsure at this time if she will be long term at [facility]. Per Marylene Land at facility, okay for return at d/c pending bed availability.   Assessment/plan status:  Psychosocial Support/Ongoing Assessment of Needs Other assessment/ plan:   Information/referral to community resources:   [facility]    PATIENT'S/FAMILY'S RESPONSE TO PLAN OF CARE: Pt unable to discuss plan of care at this time. Family report positive feelings regarding return to [facility] when medically stable. CSW will continue to follow.        Derenda Fennel, [place] [phone]

## 2012-06-29 NOTE — Progress Notes (Signed)
Family states that pt is very fatigued from the days activity.  Currently, she is w/c dependent and receiving PT at nursing Home.  There are no acute care PT needs at this time.

## 2012-06-29 NOTE — Progress Notes (Signed)
*  PRELIMINARY RESULTS* Echocardiogram 2D Echocardiogram has been performed.  Conrad La Victoria 06/29/2012, 12:18 PM

## 2012-06-29 NOTE — Progress Notes (Signed)
Attempting to sedate patient for MR of Brain, pt. Spitting medication at nurse, attempted to move patient to stretcher, pt. Careers information officer.

## 2012-06-29 NOTE — Progress Notes (Signed)
Dr. Earlene Plater in to see pt. New orders received, pt. Waiting on  Swallowing study.

## 2012-06-29 NOTE — Progress Notes (Signed)
[facility] PROGRESS NOTE  [name] ZOX:[id number] DOB: 02/05/1924 DOA: 06/28/2012 PCP: Ignatius Specking., MD  Assessment/Plan: Altered mental status/Hypoglycemia/Dehydration/ I suspect patient's altered mental status and decrease mentation was secondary to her low blood sugars. Patient is on 70/30 insulin. We'll continue to hold 70/30 insulin continue her on D5 half normal saline. Speech swallow Eval ordered. If patient passes then we'll advance her diet. Head CT was normal patient has no neurologic deficit to suggest a stroke. Patient seems back to normal. Will defer to her daughter who will see her later on this morning  Dementia Stable  DM type 2 (diabetes mellitus, type 2) Hold medications for now and monitor blood sugars.  MITRAL INSUFFICIENCY/CAD, NATIVE VESSEL/BRADYCARDIA/Chronic diastolic heart failure/ CAROTID BRUIT Cardiac markers are all negative. A 2-D echo is pending. The patient has no chest pain.  Thrombocytopenia Monitor platelets for now.   Acute kidney injury Improved with IV fluids.   Code Status:DNR Family Communication: Daughter Disposition Plan: SNF  Molli Posey, MD  [facility] Pager [phone]  If 8PM-8AM, please contact night-coverage [email or web address] Password [email or web address] 06/29/2012, 1:00 PM   LOS: 1 day   Brief narrative: Patient is a 78 year old white female from a nursing home that was noted to be hypoglycemic and confused. Not sure for how long patient was hypoglycemic. Blood sugar was in the 40's. Patient admitted for hypoglycemia with altered mental status. Blood sugars has since corrected and patient is back to her baseline.  Consultants:  None  Procedures:  CT Head 8/19  Echo 8/20  Antibiotics:  Rocephin 8/19 ?  HPI/Subjective: Patient is pleasantly demented.  Objective: Filed Vitals:   06/28/12 1629 06/28/12 1638 06/28/12 2148 06/29/12 0433  BP: 186/67  192/67 174/75  Pulse: 58  59 72  Temp: 98.1 F (36.7 C)  98.3  F (36.8 C) 98.4 F (36.9 C)  TempSrc: Oral  Oral Oral  Resp: 18  18 18   Height:  5\' 5"  (1.651 m)    Weight:  91 kg (200 lb 9.9 oz)    SpO2: 96%  96% 95%    Intake/Output Summary (Last 24 hours) at 06/29/12 1300 Last data filed at 06/29/12 8295  Gross per 24 hour  Intake      0 ml  Output   1300 ml  Net  -1300 ml    Exam:   General:  Patient is alert laying in bed  Cardiovascular: Regular rate rhythm Respiratory: Clear to auscultations bilaterally  Abdomen: Positive bowel sounds Extremities no edema Data Reviewed: Basic Metabolic Panel:  Lab 06/29/12 6213 06/28/12 0901  NA 136 137  K 3.5 3.9  CL 99 98  CO2 28 31  GLUCOSE 83 154*  BUN 24* 32*  CREATININE 1.33* 1.80*  CALCIUM 9.4 9.5  MG -- --  PHOS -- --   Liver Function Tests:  Lab 06/28/12 0901  AST 34  ALT 18  ALKPHOS 79  BILITOT 0.3  PROT 6.5  ALBUMIN 2.7*   CBC:  Lab 06/29/12 0508 06/28/12 0901  WBC 3.8* 6.0  NEUTROABS -- 4.8  HGB 11.7* 11.3*  HCT 34.0* 33.3*  MCV 98.3 99.1  PLT 118* 120*   Cardiac Enzymes:  Lab 06/29/12 0819 06/29/12 0029 06/28/12 1655 06/28/12 0901  CKTOTAL 41 40 38 --  CKMB 2.5 2.8 3.0 --  CKMBINDEX -- -- -- --  TROPONINI <0.30 <0.30 <0.30 <0.30   CBG:  Lab 06/29/12 1147 06/29/12 0834 06/29/12 0734 06/29/12 0634 06/29/12 0537  GLUCAP 89 91 84 74  86    Recent Results (from the past 240 hour(s))  STOOL CULTURE     Status: Normal (Preliminary result)   Collection Time   06/28/12 11:54 AM      Component Value Range Status Comment   Specimen Description STOOL   Final    Special Requests NONE   Final    Culture Culture reincubated for better growth   Final    Report Status PENDING   Incomplete   CLOSTRIDIUM DIFFICILE BY PCR     Status: Normal   Collection Time   06/28/12 11:54 AM      Component Value Range Status Comment   C difficile by pcr NEGATIVE  NEGATIVE Final   MRSA PCR SCREENING     Status: Normal   Collection Time   06/28/12  4:57 PM      Component  Value Range Status Comment   MRSA by PCR NEGATIVE  NEGATIVE Final      Studies: Dg Chest 1 View  06/28/2012  *RADIOLOGY REPORT*  Clinical Data: Hypoglycemia, stroke symptoms, COPD, CHF, hypertension, atrial fibrillation  CHEST - 1 VIEW  Comparison: 04/01/2012  Findings: Enlargement of cardiac silhouette with pulmonary vascular congestion. Atherosclerotic calcification thoracic aorta. Scattered interstitial changes question mild interstitial edema. No definite focal segmental consolidation, pleural effusion or pneumothorax. Bones appear demineralized.  IMPRESSION: Question mild CHF.   Original Report Authenticated By: Lollie Marrow, M.D. ( 06/28/2012 09:40:54 )    Ct Head Wo Contrast  06/28/2012  *RADIOLOGY REPORT*  Clinical Data: Altered mental status, noncommunicative, history dementia, lupus, hypertension, CHF, COPD  CT HEAD WITHOUT CONTRAST  Technique:  Contiguous axial images were obtained from the base of the skull through the vertex without contrast.  Comparison: 04/01/2012  Findings: Generalized atrophy. Normal ventricular morphology. No midline shift or mass effect. Small vessel chronic ischemic changes of deep cerebral white matter. Small old lacunar infarct left caudate. No intracranial hemorrhage, mass lesion, or acute infarction. Visualized paranasal sinuses and mastoid air cells clear. Bones unremarkable. Atherosclerotic calcifications at skull base.  IMPRESSION: Atrophy with small vessel chronic ischemic changes of deep cerebral white matter. Old lacunar infarct left caudate. No acute intracranial abnormalities. No interval change.   Original Report Authenticated By: Lollie Marrow, M.D. ( 06/28/2012 09:42:25 )     Scheduled Meds:   . allopurinol  100 mg Oral Daily  . ALPRAZolam  0.5 mg Oral QHS  . amLODipine  5 mg Oral Daily  . aspirin  81 mg Oral Daily  . cefTRIAXone (ROCEPHIN)  IV  1 g Intravenous Q24H  . dextrose  25 g Intravenous Once  . dextrose  25 mL Intravenous Once  .  dextrose      . enoxaparin (LOVENOX) injection  40 mg Subcutaneous Q24H  . isosorbide mononitrate  20 mg Oral BID  . multivitamin with minerals  1 tablet Oral Daily  . pantoprazole  40 mg Oral Q1200  . simvastatin  10 mg Oral QHS  . cyanocobalamin  1,000 mcg Oral Daily  . DISCONTD: enoxaparin (LOVENOX) injection  30 mg Subcutaneous Q24H   Continuous Infusions:   . dextrose 5 % and 0.45% NaCl 50 mL/hr at 06/28/12 1740  . DISCONTD: sodium chloride       Time Spent:25 min

## 2012-06-29 NOTE — Progress Notes (Signed)
INITIAL ADULT NUTRITION ASSESSMENT Date: 06/29/2012   Time: 1:58 PM Reason for Assessment: Nutrition Risk-Dysphagia, Braden Score <12  ASSESSMENT: Female 78 y.o.  Dx: hypoglycemia, altered mental status   Past Medical History  Diagnosis Date  . CHF (congestive heart failure)   . COPD (chronic obstructive pulmonary disease)   . Diabetes mellitus   . A-fib   . Lupus   . Alzheimer's dementia   . Aortic disease   . Hypertension   . Gout   . High cholesterol     Scheduled Meds:   . allopurinol  100 mg Oral Daily  . ALPRAZolam  0.5 mg Oral QHS  . amLODipine  5 mg Oral Daily  . aspirin  81 mg Oral Daily  . cefTRIAXone (ROCEPHIN)  IV  1 g Intravenous Q24H  . dextrose  25 g Intravenous Once  . dextrose  25 mL Intravenous Once  . dextrose      . enoxaparin (LOVENOX) injection  40 mg Subcutaneous Q24H  . isosorbide mononitrate  20 mg Oral BID  . multivitamin with minerals  1 tablet Oral Daily  . pantoprazole  40 mg Oral Q1200  . simvastatin  10 mg Oral QHS  . cyanocobalamin  1,000 mcg Oral Daily  . DISCONTD: enoxaparin (LOVENOX) injection  30 mg Subcutaneous Q24H   Continuous Infusions:   . dextrose 5 % and 0.45% NaCl 50 mL/hr at 06/28/12 1740  . DISCONTD: sodium chloride     PRN Meds:.acetaminophen, acetaminophen, albuterol, alum & mag hydroxide-simeth, guaiFENesin-dextromethorphan, ondansetron (ZOFRAN) IV, ondansetron  Ht: 5\' 5"  (165.1 cm)  Wt: 200 lb 9.9 oz (91 kg)  Ideal Wt: 57 kg  % Ideal Wt: 160%  Usual Wt: ~200# (91kg) % Usual Wt: 100%  Body mass index is 33.38 kg/(m^2). Obesity Class 1  Food/Nutrition Related Hx: Pt awake and pulling on bedding. Daughters present and provided hx. She lives at local SNF. Her usual diet is Mech.Soft thin liquids. Appetite variable. Needs assistance with meals. Her wt has been stable except for some excess fluid for which she has been receiving Lasix (hx of CHF). ST evaluation pending. Pt Braden Score =11 currently which places  her a high risk for skin breakdown.  CMP     Component Value Date/Time   NA 136 06/29/2012 0508   K 3.5 06/29/2012 0508   CL 99 06/29/2012 0508   CO2 28 06/29/2012 0508   GLUCOSE 83 06/29/2012 0508   BUN 24* 06/29/2012 0508   CREATININE 1.33* 06/29/2012 0508   CALCIUM 9.4 06/29/2012 0508   PROT 6.5 06/28/2012 0901   ALBUMIN 2.7* 06/28/2012 0901   AST 34 06/28/2012 0901   ALT 18 06/28/2012 0901   ALKPHOS 79 06/28/2012 0901   BILITOT 0.3 06/28/2012 0901   GFRNONAA 35* 06/29/2012 0508   GFRAA 41* 06/29/2012 0508    Intake/Output Summary (Last 24 hours) at 06/29/12 1408 Last data filed at 06/29/12 3086  Gross per 24 hour  Intake      0 ml  Output   1300 ml  Net  -1300 ml    Diet Order: Carb Control  Supplements:B-12  and MVI daily  IVF:    dextrose 5 % and 0.45% NaCl Last Rate: 50 mL/hr at 06/28/12 1740  DISCONTD: sodium chloride     Estimated Nutritional Needs:   Kcal:1500-1675  kcal/day Protein:70-80 gr/day Fluid:1 ml/kcal  NUTRITION DIAGNOSIS: -Swallowing difficulty (NI-1.1).  Status: Ongoing  RELATED TO: altered mental status?  AS EVIDENCE BY: pt admission hx  MONITORING/EVALUATION(Goals): Monitor diet progression and results of ST evaluation Goal: Pt to meet >/= 90% of their estimated nutrition needs.  EDUCATION NEEDS: -Education not appropriate at this time  INTERVENTION: -Assist with feeding -Add appropriate oral supplement when ST eval is completed RD to follow for nutrition needs  Dietitian [phone]  DOCUMENTATION CODES Per approved criteria  -Obesity Unspecified    Francene Boyers 06/29/2012, 1:58 PM

## 2012-06-29 NOTE — Evaluation (Signed)
Clinical/Bedside Swallow Evaluation Patient Details  Name: [name] MRN: [id number] Date of Birth: 02-28-1924  Today's Date: 06/29/2012 Time: 1202-1255 SLP Time Calculation (min): 53 min  Past Medical History:  Past Medical History  Diagnosis Date  . CHF (congestive heart failure)   . COPD (chronic obstructive pulmonary disease)   . Diabetes mellitus   . A-fib   . Lupus   . Alzheimer's dementia   . Aortic disease   . Hypertension   . Gout   . High cholesterol    Past Surgical History:  Past Surgical History  Procedure Date  . Cholecystectomy   . Foot surgery   . Tubal ligation   . Tympanostomy tube placement    HPI:  Patient is a 78 year old white female from a nursing home that was noted to be hypoglycemic and confused. Not sure for how long patient was hypoglycemic. Blood sugar was in the 40's. Patient admitted for hypoglycemia with altered mental status. Blood sugars have since corrected and patient is close to her baseline.   Assessment / Plan / Recommendation Clinical Impression  Pt resisitant to accepting po trials from SLP, but more willing to take from family member. Dry, strong coughing elicited after biscuits and gravy. Coughing decreased when presented with mostly gravy and small amount biscuit. She also appeared to benefit from alternating with a sip of liquid, however she is resistant to drinking. Pt was seen in May 2013 and was regurgitating foods at that time suggestive of esophageal component. Recommend D2 (ground) with thin liquids with strict reflux precautions. Education provided and accepted to family.     Aspiration Risk  Mild    Diet Recommendation Dysphagia 2 (Fine chop);Thin liquid   Liquid Administration via: Straw;Cup Medication Administration: Crushed with puree Supervision: Staff feed patient;Trained caregiver to feed patient Compensations: Slow rate;Small sips/bites Postural Changes and/or Swallow Maneuvers: Seated upright 90  degrees;Upright 30-60 min after meal    Other  Recommendations Oral Care Recommendations: Oral care BID;Staff/trained caregiver to provide oral care Other Recommendations: Clarify dietary restrictions   Follow Up Recommendations  24 hour supervision/assistance    Frequency and Duration min 1 x/week  1 week   Pertinent Vitals/Pain     SLP Swallow Goals Patient will consume recommended diet without observed clinical signs of aspiration with: Moderate assistance Patient will utilize recommended strategies during swallow to increase swallowing safety with: Moderate assistance   Swallow Study Prior Functional Status   Living at [facility]    General Date of Onset: 06/28/12 HPI: Patient is a 78 year old white female from a nursing home that was noted to be hypoglycemic and confused. Not sure for how long patient was hypoglycemic. Blood sugar was in the 40's. Patient admitted for hypoglycemia with altered mental status. Blood sugars have since corrected and patient is close to her baseline. Type of Study: Bedside swallow evaluation Previous Swallow Assessment: 03/22/2012 D3/thin BSE Diet Prior to this Study: NPO Temperature Spikes Noted: No Respiratory Status: Room air History of Recent Intubation: No Behavior/Cognition: Alert;Requires cueing;Hard of hearing Oral Cavity - Dentition: Dentures, not available;Edentulous Self-Feeding Abilities: Needs assist Patient Positioning: Upright in bed Baseline Vocal Quality: Clear Volitional Cough: Cognitively unable to elicit Volitional Swallow: Unable to elicit    Oral/Motor/Sensory Function Overall Oral Motor/Sensory Function: Appears within functional limits for tasks assessed (pt unable to follow commands due to decreased cognition)   Ice Chips Ice chips: Within functional limits Presentation: Spoon   Thin Liquid Thin Liquid: Within functional limits Presentation: Straw  Nectar Thick Nectar Thick Liquid: Not tested   Honey Thick  Honey Thick Liquid: Not tested   Puree Puree: Within functional limits Presentation: Spoon   Solid     Thank you,  Havery Moros, CCC-SLP [phone]  Solid: Impaired Presentation: Spoon Oral Phase Impairments: Reduced labial seal;Poor awareness of bolus Oral Phase Functional Implications: Oral residue Pharyngeal Phase Impairments: Cough - Delayed;Cough - Immediate       PORTER,DABNEY 06/29/2012,2:55 PM

## 2012-06-30 DIAGNOSIS — N390 Urinary tract infection, site not specified: Secondary | ICD-10-CM | POA: Diagnosis present

## 2012-06-30 DIAGNOSIS — G9341 Metabolic encephalopathy: Secondary | ICD-10-CM

## 2012-06-30 LAB — GLUCOSE, CAPILLARY

## 2012-06-30 LAB — URINE CULTURE

## 2012-06-30 MED ORDER — ENSURE COMPLETE PO LIQD: 237.0000 mL | Freq: Two times a day (BID) | ORAL | Status: DC

## 2012-06-30 MED ORDER — HYDRALAZINE HCL 25 MG PO TABS
50.0000 mg | ORAL_TABLET | Freq: Three times a day (TID) | ORAL | Status: DC
  Administered 2012-06-30: 50 mg via ORAL
  Filled 2012-06-30: qty 2

## 2012-06-30 MED ORDER — ALPRAZOLAM 0.5 MG PO TABS
0.5000 mg | ORAL_TABLET | Freq: Once | ORAL | Status: DC
  Filled 2012-06-30: qty 1

## 2012-06-30 MED ORDER — SODIUM CHLORIDE 0.9 % IJ SOLN
INTRAMUSCULAR | Status: AC
  Administered 2012-06-30: 15:00:00
  Filled 2012-06-30: qty 6

## 2012-06-30 MED ORDER — CIPROFLOXACIN HCL 500 MG PO TABS: 500.0000 mg | ORAL_TABLET | Freq: Two times a day (BID) | ORAL | Status: AC

## 2012-06-30 MED ORDER — HYDRALAZINE HCL 50 MG PO TABS: 50.0000 mg | ORAL_TABLET | Freq: Three times a day (TID) | ORAL | Status: AC

## 2012-06-30 NOTE — Progress Notes (Signed)
Pt family called out for help because they said pt was choking. Melinda Regal, RN and I assessed pt she was coughing, she then started talking to [place] and her lungs did not sound wet. [name]

## 2012-06-30 NOTE — Plan of Care (Signed)
Problem: Phase I Progression Outcomes Goal: Voiding-avoid urinary catheter unless indicated Outcome: Not Progressing Pt had catheter, but d/c'd at d/c at to [facility].

## 2012-06-30 NOTE — Progress Notes (Signed)
Report called to Angie at the [facility] [place]. Pt d/c via stretcher with EMS. [name]

## 2012-06-30 NOTE — Discharge Summary (Signed)
Physician Discharge Summary  [name]:[id number] DOB: September 08, 1924 DOA: 06/28/2012  PCP: Ignatius Specking., MD  Admit date: 06/28/2012 Discharge date: 06/30/2012  Recommendations for Outpatient Follow-up:  1. Return to skilled nursing facility for long-term care. 2. Strongly recommend outpatient hospice consult for her end-stage dementia 3. Change diabetic regimen to sliding scale insulin as needed rather than long-acting insulin since patient has poor by mouth intake.  Discharge Diagnoses:  Principal Problem:  *Metabolic encephalopathy Active Problems:  MITRAL INSUFFICIENCY  CAD, NATIVE VESSEL  BRADYCARDIA  Chronic diastolic heart failure  DYSPNEA  CAROTID BRUIT  Dehydration  Dementia  DM type 2 (diabetes mellitus, type 2)  Hypoglycemia  AKI (acute kidney injury)  Urinary tract infection   Discharge Condition: Stable  Diet recommendation: Dysphagia 2 diet with thin liquids  Filed Weights   06/28/12 1638  Weight: 91 kg (200 lb 9.9 oz)    History of present illness:  Patient is a 78 year old white female with past medical history significant for congestive heart failure, COPD, diabetes, atrial fibrillation, hypertension and gout. Per discussion with patient's daughter as patient is unable to give history, patient developed decreased mental status at the nursing home. Her blood sugar was checked and it was 40. Patient was given D50 blood sugar improved to 89. EMS was called. Patient's blood sugar now is normalized but she continues to have altered mental status. Unsure how long blood sugars have been low since patient is unable to tell. She does not complain of any chest pain no shortness of breath no nausea or vomiting. Per discussion with daughter patient has dementia and normally cannot tell you how she feels. she normally is more talkative. Daughter thinks patient was also leaning toward the right side initially. Patient is non-mobile at baseline. But she normally is able to  hold her head up straight.   Hospital Course:  This is an 78 year old lady with end-stage dementia who is a resident of a nursing facility. She was brought to the emergency room when she had worsening in her mental status. She was found to have a blood sugar of 40 and was given D50. She is on 7030 insulin as an outpatient. Her family reports her by mouth intake has been poor over the past 2 days. She was initially treated with a dextrose infusion, but this has since been discontinued. It is recommended that her long-acting insulin be discontinued since her by mouth intake is unreliable. She would benefit from sliding scale insulin as needed. If her blood sugars are consistently elevated, then a long-acting insulin may be considered at that point. Her mental status has returned to baseline. At baseline, her family reports that she often does not recognize family members. She does not feed herself, and is essentially bedbound. She often refuses to eat. Currently her mental status has improved today. Her family was able to feed her breakfast. Further workup also revealed a Klebsiella urinary tract infection. She was started on Rocephin. Culture showed Klebsiella was sensitive to Cipro. She'll complete a total of 7 days of antibiotics.  I had an extensive discussion with the patient's power of attorney as well as her other daughter. I explained to him the patient has end-stage dementia and will likely continue to decline. Her by mouth intake has been poor. Her functional status is very poor. Her overall prognosis is also very poor. Options including feeding tube were discussed and the family has elected not to pursue this. They wish to see how she does at  the nursing home and if she continues to decline, then  they have elected to pursue a quality of life/comfort care approach and wish that hospice will be involved in their mother's care. This can hopefully be arranged on an outpatient basis. They have agreed to a  DO NOT RESUSCITATE order  Procedures:  None  Consultations:  None  Discharge Exam: Filed Vitals:   06/30/12 1359  BP: 110/65  Pulse: 64  Temp: 97.4 F (36.3 C)  Resp: 20   Filed Vitals:   06/30/12 0650 06/30/12 0746 06/30/12 0852 06/30/12 1359  BP: 188/71  196/67 110/65  Pulse: 62 63 60 64  Temp: 97.2 F (36.2 C)   97.4 F (36.3 C)  TempSrc: Oral     Resp: 18 65  20  Height:      Weight:      SpO2: 95% 96%  94%    General: No acute distress, she is somnolent and recently received Xanax Cardiovascular: S1, S2, regular rate and rhythm Respiratory: Rhonchi bilaterally  Discharge Instructions  Discharge Orders    Future Orders Please Complete By Expires   Diet general      Comments:   Dysphagia 2 with thin liquids   Increase activity slowly        Medication List  As of 06/30/2012  2:53 PM   STOP taking these medications         ALPRAZolam 0.25 MG tablet      aspirin 81 MG chewable tablet      linezolid 600 MG tablet      simvastatin 10 MG tablet         TAKE these medications         allopurinol 100 MG tablet   Commonly known as: ZYLOPRIM   Take 100 mg by mouth every morning.      ALPRAZolam 0.5 MG tablet   Commonly known as: XANAX   Take 0.5 mg by mouth at bedtime.      amLODipine 5 MG tablet   Commonly known as: NORVASC   Take 5 mg by mouth daily.      ciprofloxacin 500 MG tablet   Commonly known as: CIPRO   Take 1 tablet (500 mg total) by mouth 2 (two) times daily. Until 8/25      cyanocobalamin 500 MCG tablet   Take 1,000 mcg by mouth daily.      furosemide 20 MG tablet   Commonly known as: LASIX   Take 20 mg by mouth daily as needed. Swelling      hydrALAZINE 50 MG tablet   Commonly known as: APRESOLINE   Take 1 tablet (50 mg total) by mouth every 8 (eight) hours.      isosorbide mononitrate 20 MG tablet   Commonly known as: ISMO,MONOKET   Take 20 mg by mouth 2 (two) times daily.      loperamide 2 MG capsule   Commonly  known as: IMODIUM   Take 2 mg by mouth 4 (four) times daily as needed. Loose Stools      omeprazole 20 MG capsule   Commonly known as: PRILOSEC   Take 20 mg by mouth every morning.              The results of significant diagnostics from this hospitalization (including imaging, microbiology, ancillary and laboratory) are listed below for reference.    Significant Diagnostic Studies: Dg Chest 1 View  06/28/2012  *RADIOLOGY REPORT*  Clinical Data: Hypoglycemia, stroke symptoms, COPD, CHF,  hypertension, atrial fibrillation  CHEST - 1 VIEW  Comparison: 04/01/2012  Findings: Enlargement of cardiac silhouette with pulmonary vascular congestion. Atherosclerotic calcification thoracic aorta. Scattered interstitial changes question mild interstitial edema. No definite focal segmental consolidation, pleural effusion or pneumothorax. Bones appear demineralized.  IMPRESSION: Question mild CHF.   Original Report Authenticated By: Lollie Marrow, M.D. ( 06/28/2012 09:40:54 )    Ct Head Wo Contrast  06/28/2012  *RADIOLOGY REPORT*  Clinical Data: Altered mental status, noncommunicative, history dementia, lupus, hypertension, CHF, COPD  CT HEAD WITHOUT CONTRAST  Technique:  Contiguous axial images were obtained from the base of the skull through the vertex without contrast.  Comparison: 04/01/2012  Findings: Generalized atrophy. Normal ventricular morphology. No midline shift or mass effect. Small vessel chronic ischemic changes of deep cerebral white matter. Small old lacunar infarct left caudate. No intracranial hemorrhage, mass lesion, or acute infarction. Visualized paranasal sinuses and mastoid air cells clear. Bones unremarkable. Atherosclerotic calcifications at skull base.  IMPRESSION: Atrophy with small vessel chronic ischemic changes of deep cerebral white matter. Old lacunar infarct left caudate. No acute intracranial abnormalities. No interval change.   Original Report Authenticated By: Lollie Marrow,  M.D. ( 06/28/2012 09:42:25 )     Microbiology: Recent Results (from the past 240 hour(s))  URINE CULTURE     Status: Normal   Collection Time   06/28/12 11:29 AM      Component Value Range Status Comment   Specimen Description URINE, CATHETERIZED   Final    Special Requests NONE   Final    Culture  Setup Time 06/28/2012 19:20   Final    Colony Count >=100,000 COLONIES/ML   Final    Culture KLEBSIELLA PNEUMONIAE   Final    Report Status 06/30/2012 FINAL   Final    Organism ID, Bacteria KLEBSIELLA PNEUMONIAE   Final   STOOL CULTURE     Status: Normal (Preliminary result)   Collection Time   06/28/12 11:54 AM      Component Value Range Status Comment   Specimen Description STOOL   Final    Special Requests NONE   Final    Culture NO SUSPICIOUS COLONIES, CONTINUING TO HOLD   Final    Report Status PENDING   Incomplete   CLOSTRIDIUM DIFFICILE BY PCR     Status: Normal   Collection Time   06/28/12 11:54 AM      Component Value Range Status Comment   C difficile by pcr NEGATIVE  NEGATIVE Final   MRSA PCR SCREENING     Status: Normal   Collection Time   06/28/12  4:57 PM      Component Value Range Status Comment   MRSA by PCR NEGATIVE  NEGATIVE Final      Labs: Basic Metabolic Panel:  Lab 06/29/12 1308 06/28/12 0901  NA 136 137  K 3.5 3.9  CL 99 98  CO2 28 31  GLUCOSE 83 154*  BUN 24* 32*  CREATININE 1.33* 1.80*  CALCIUM 9.4 9.5  MG -- --  PHOS -- --   Liver Function Tests:  Lab 06/28/12 0901  AST 34  ALT 18  ALKPHOS 79  BILITOT 0.3  PROT 6.5  ALBUMIN 2.7*   No results found for this basename: LIPASE:5,AMYLASE:5 in the last 168 hours No results found for this basename: AMMONIA:5 in the last 168 hours CBC:  Lab 06/29/12 0508 06/28/12 0901  WBC 3.8* 6.0  NEUTROABS -- 4.8  HGB 11.7* 11.3*  HCT 34.0* 33.3*  MCV 98.3 99.1  PLT 118* 120*   Cardiac Enzymes:  Lab 06/29/12 0819 06/29/12 0029 06/28/12 1655 06/28/12 0901  CKTOTAL 41 40 38 --  CKMB 2.5 2.8 3.0 --    CKMBINDEX -- -- -- --  TROPONINI <0.30 <0.30 <0.30 <0.30   BNP: BNP (last 3 results)  Basename 06/28/12 1655 12/15/11 1701 10/10/11 1315  PROBNP 546.1* 105.2 85.4   CBG:  Lab 06/30/12 1117 06/30/12 0739 06/29/12 2058 06/29/12 1731 06/29/12 1437  GLUCAP 123* 119* 108* 110* 146*    Time coordinating discharge: Greater than 30 minutes  Signed:  MEMON,JEHANZEB  [facility] 06/30/2012, 2:53 PM

## 2012-06-30 NOTE — Clinical Social Work Note (Signed)
Pt d/c today back to [facility]. Pt not oriented. Pt's daughters and facility aware and agreeable. D/C summary faxed. Pt to transfer via [facility].    Derenda Fennel, [place] [phone]

## 2012-07-02 LAB — STOOL CULTURE

## 2012-09-16 IMAGING — CR DG CHEST 2V
1 series · 1 of 1 positions shown · non-contrast
Comparison: 12/03/2010

CLINICAL DATA: Fatigue, hypertension, COPD, diabetes

CHEST - 2 VIEW

[view not recorded]
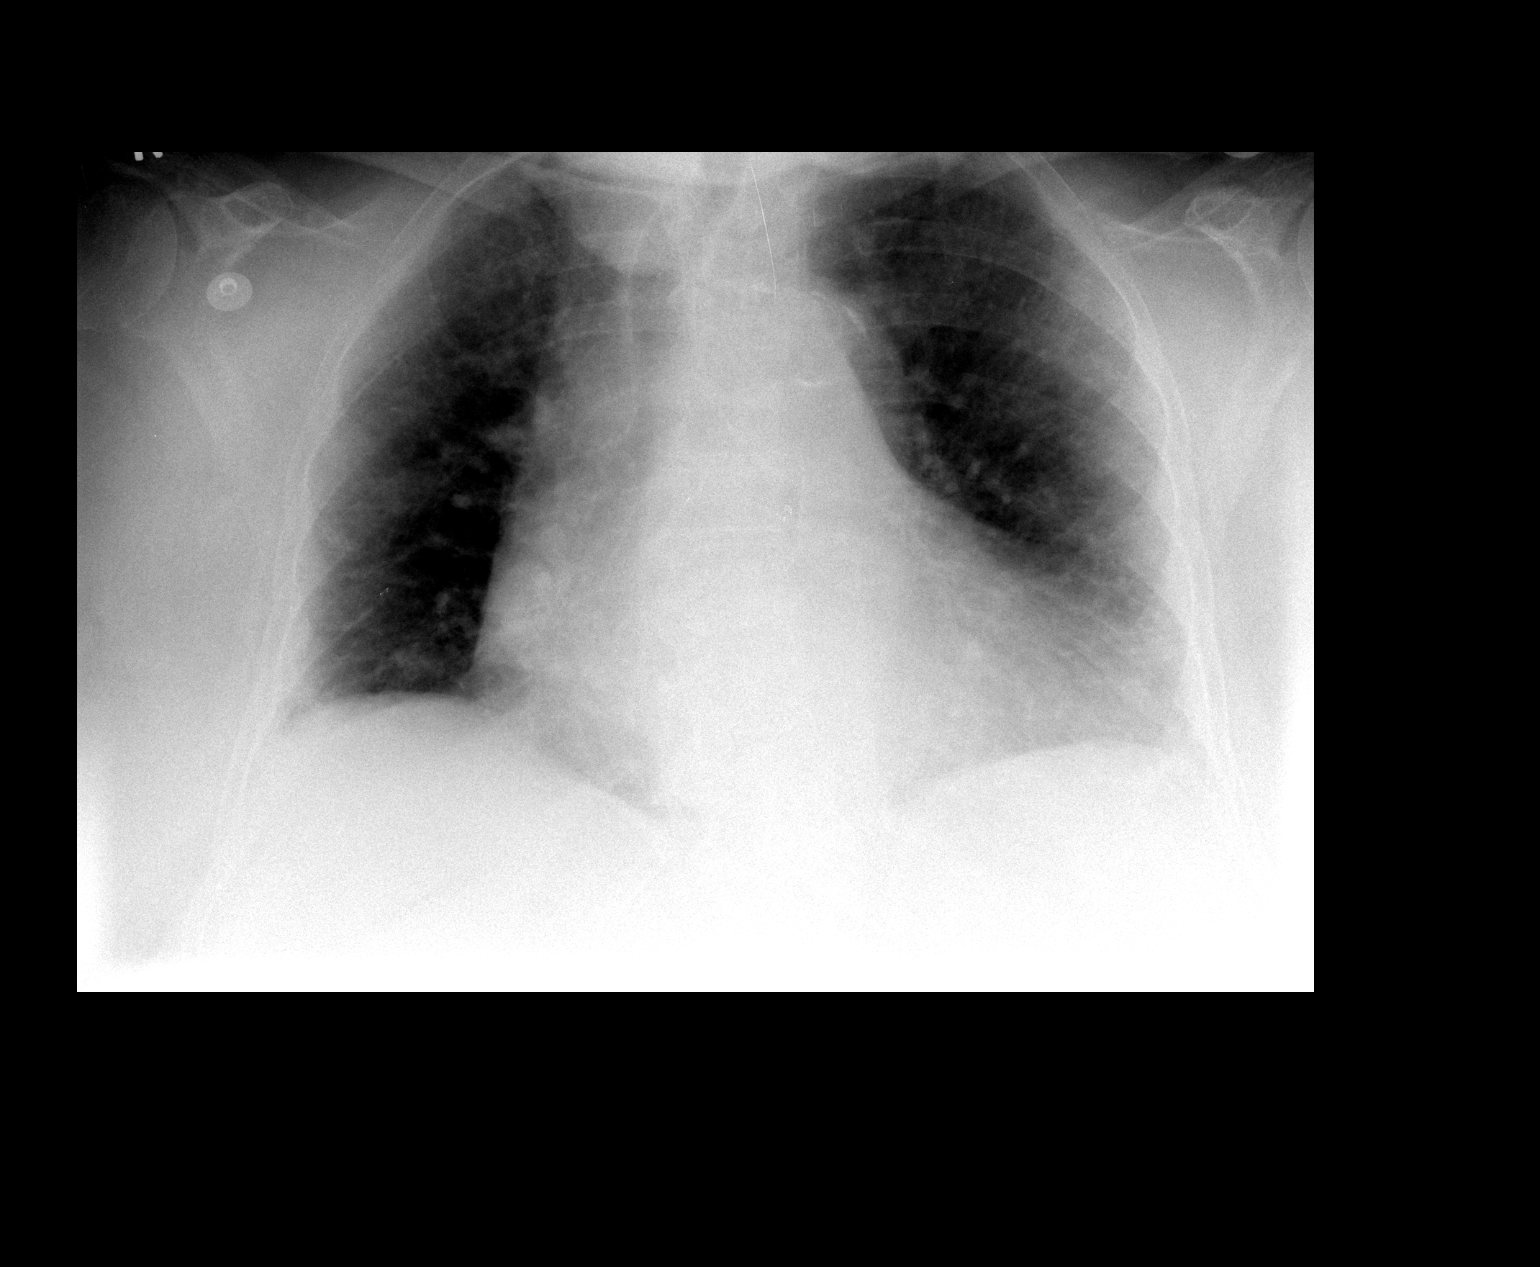

[1 of 1 positions shown; findings below may reference images not displayed]

FINDINGS: The cardiac silhouette remains enlarged with stable
diffuse chronic interstitial changes.  Suspect chronic interstitial
lung disease versus chronic edema.  No enlarging effusion or
pneumothorax.  Atherosclerosis of the aorta.  Degenerative changes
of the spine.  Overall stable exam.
IMPRESSION: Stable cardiomegaly with chronic interstitial lung disease versus
chronic edema.

## 2014-01-08 DEATH — deceased

## 2014-02-03 ENCOUNTER — Telehealth: Payer: Self-pay

## 2014-02-03 NOTE — Telephone Encounter (Signed)
Patient past away @ [facility] in [place] [employer] °

## 2014-09-11 ENCOUNTER — Encounter (HOSPITAL_COMMUNITY): Payer: Self-pay | Admitting: Emergency Medicine
